# Patient Record
Sex: Female | Born: 1945
Health system: Southern US, Community
[De-identification: ages and names within clinical notes are randomized; demographics above are authoritative.]

## PROBLEM LIST (undated history)

## (undated) DIAGNOSIS — E7800 Pure hypercholesterolemia, unspecified: Secondary | ICD-10-CM

## (undated) DIAGNOSIS — E119 Type 2 diabetes mellitus without complications: Secondary | ICD-10-CM

## (undated) DIAGNOSIS — K219 Gastro-esophageal reflux disease without esophagitis: Secondary | ICD-10-CM

## (undated) DIAGNOSIS — J45909 Unspecified asthma, uncomplicated: Secondary | ICD-10-CM

## (undated) DIAGNOSIS — E079 Disorder of thyroid, unspecified: Secondary | ICD-10-CM

## (undated) DIAGNOSIS — L309 Dermatitis, unspecified: Secondary | ICD-10-CM

## (undated) DIAGNOSIS — I10 Essential (primary) hypertension: Secondary | ICD-10-CM

## (undated) HISTORY — PX: THYROID SURGERY: SHX805

## (undated) HISTORY — PX: HAND SURGERY: SHX662

## (undated) HISTORY — PX: ABDOMINAL HYSTERECTOMY: SHX81

## (undated) HISTORY — DX: Dermatitis, unspecified: L30.9

## (undated) HISTORY — PX: OTHER SURGICAL HISTORY: SHX169

## (undated) HISTORY — PX: UTERINE FIBROID SURGERY: SHX826

---

## 1998-12-15 ENCOUNTER — Encounter: Payer: Self-pay | Admitting: Family Medicine

## 1998-12-15 ENCOUNTER — Ambulatory Visit (HOSPITAL_COMMUNITY): Admission: RE | Admit: 1998-12-15 | Discharge: 1998-12-15 | Payer: Self-pay | Admitting: Family Medicine

## 1999-01-08 ENCOUNTER — Ambulatory Visit (HOSPITAL_COMMUNITY): Admission: RE | Admit: 1999-01-08 | Discharge: 1999-01-08 | Payer: Self-pay | Admitting: Family Medicine

## 1999-01-08 ENCOUNTER — Encounter: Payer: Self-pay | Admitting: Family Medicine

## 1999-02-05 ENCOUNTER — Encounter: Payer: Self-pay | Admitting: Family Medicine

## 1999-02-07 ENCOUNTER — Ambulatory Visit (HOSPITAL_COMMUNITY): Admission: RE | Admit: 1999-02-07 | Discharge: 1999-02-08 | Payer: Self-pay | Admitting: General Surgery

## 1999-11-15 ENCOUNTER — Other Ambulatory Visit: Admission: RE | Admit: 1999-11-15 | Discharge: 1999-11-15 | Payer: Self-pay | Admitting: *Deleted

## 1999-12-12 ENCOUNTER — Ambulatory Visit (HOSPITAL_COMMUNITY): Admission: RE | Admit: 1999-12-12 | Discharge: 1999-12-12 | Payer: Self-pay | Admitting: Gastroenterology

## 2001-11-30 ENCOUNTER — Encounter: Payer: Self-pay | Admitting: Family Medicine

## 2001-11-30 ENCOUNTER — Ambulatory Visit (HOSPITAL_COMMUNITY): Admission: RE | Admit: 2001-11-30 | Discharge: 2001-11-30 | Payer: Self-pay | Admitting: Family Medicine

## 2002-01-06 ENCOUNTER — Ambulatory Visit (HOSPITAL_COMMUNITY): Admission: RE | Admit: 2002-01-06 | Discharge: 2002-01-06 | Payer: Self-pay | Admitting: Family Medicine

## 2002-01-06 ENCOUNTER — Encounter: Payer: Self-pay | Admitting: Family Medicine

## 2002-03-19 ENCOUNTER — Ambulatory Visit (HOSPITAL_COMMUNITY): Admission: RE | Admit: 2002-03-19 | Discharge: 2002-03-19 | Payer: Self-pay | Admitting: Family Medicine

## 2002-03-19 ENCOUNTER — Encounter: Payer: Self-pay | Admitting: Family Medicine

## 2002-12-03 ENCOUNTER — Encounter: Payer: Self-pay | Admitting: Family Medicine

## 2002-12-03 ENCOUNTER — Ambulatory Visit (HOSPITAL_COMMUNITY): Admission: RE | Admit: 2002-12-03 | Discharge: 2002-12-03 | Payer: Self-pay | Admitting: Family Medicine

## 2003-12-06 ENCOUNTER — Ambulatory Visit (HOSPITAL_COMMUNITY): Admission: RE | Admit: 2003-12-06 | Discharge: 2003-12-06 | Payer: Self-pay | Admitting: Family Medicine

## 2004-05-29 ENCOUNTER — Ambulatory Visit (HOSPITAL_COMMUNITY): Admission: RE | Admit: 2004-05-29 | Discharge: 2004-05-29 | Payer: Self-pay | Admitting: Family Medicine

## 2004-09-05 ENCOUNTER — Other Ambulatory Visit: Admission: RE | Admit: 2004-09-05 | Discharge: 2004-09-05 | Payer: Self-pay | Admitting: Family Medicine

## 2004-12-20 ENCOUNTER — Ambulatory Visit (HOSPITAL_COMMUNITY): Admission: RE | Admit: 2004-12-20 | Discharge: 2004-12-20 | Payer: Self-pay | Admitting: Family Medicine

## 2005-12-27 ENCOUNTER — Ambulatory Visit (HOSPITAL_COMMUNITY): Admission: RE | Admit: 2005-12-27 | Discharge: 2005-12-27 | Payer: Self-pay | Admitting: Family Medicine

## 2006-12-30 ENCOUNTER — Ambulatory Visit (HOSPITAL_COMMUNITY): Admission: RE | Admit: 2006-12-30 | Discharge: 2006-12-30 | Payer: Self-pay | Admitting: Family Medicine

## 2008-01-01 ENCOUNTER — Ambulatory Visit (HOSPITAL_COMMUNITY): Admission: RE | Admit: 2008-01-01 | Discharge: 2008-01-01 | Payer: Self-pay | Admitting: Family Medicine

## 2009-01-11 ENCOUNTER — Ambulatory Visit (HOSPITAL_COMMUNITY): Admission: RE | Admit: 2009-01-11 | Discharge: 2009-01-11 | Payer: Self-pay | Admitting: Obstetrics and Gynecology

## 2010-01-12 ENCOUNTER — Ambulatory Visit (HOSPITAL_COMMUNITY): Admission: RE | Admit: 2010-01-12 | Discharge: 2010-01-12 | Payer: Self-pay | Admitting: Family Medicine

## 2010-12-21 ENCOUNTER — Other Ambulatory Visit (HOSPITAL_COMMUNITY): Payer: Self-pay | Admitting: Family Medicine

## 2010-12-21 DIAGNOSIS — Z1239 Encounter for other screening for malignant neoplasm of breast: Secondary | ICD-10-CM

## 2011-01-14 ENCOUNTER — Ambulatory Visit (HOSPITAL_COMMUNITY): Payer: Self-pay

## 2011-01-15 ENCOUNTER — Ambulatory Visit (HOSPITAL_COMMUNITY)
Admission: RE | Admit: 2011-01-15 | Discharge: 2011-01-15 | Disposition: A | Discharge status: Home / Self Care | Payer: Medicare Other | Source: Ambulatory Visit | Attending: Family Medicine | Admitting: Family Medicine

## 2011-01-15 DIAGNOSIS — Z1239 Encounter for other screening for malignant neoplasm of breast: Secondary | ICD-10-CM

## 2011-01-15 DIAGNOSIS — Z1231 Encounter for screening mammogram for malignant neoplasm of breast: Secondary | ICD-10-CM | POA: Insufficient documentation

## 2011-04-19 NOTE — Procedures (Signed)
[place]  Patient:    Valerie Thomas                         MRN: [id number] Proc. Date: 12/12/99 Adm. Date:  [id number] Attending:  Charna Elizabeth CC:         Geraldo Pitter, M.D.                           Procedure Report  DATE OF BIRTH:  06-11-1946.  REFERRING PHYSICIAN:  Geraldo Pitter, M.D.  PROCEDURE PERFORMED:  Flexible sigmoidoscopy.  ENDOSCOPIST:  Anselmo Rod, M.D.  INSTRUMENT USED:  Olympus video colonoscope.  INDICATIONS FOR PROCEDURE:  The patient is a 65 year old black female undergoing screening flexible sigmoidoscopy to rule out polyps, masses, etc.  PREPROCEDURE PREPARATION:  Informed consent was procured from the patient.  The  patient was fasted for eight hours prior to the procedure and prepped with two Fleets enemas the morning of the procedure.  PREPROCEDURE PHYSICAL:  The patient had stable vital signs.  Neck supple. Chest clear to auscultation.  S1, S2 regular.  Abdomen soft with normal abdominal bowel sounds.  DESCRIPTION OF PROCEDURE:  The patient was placed in the left lateral decubitus  position.  No sedation was used.  Once the patient was adequately positioned, the Olympus video colonoscope was advanced from the rectum to 40 cm.  There was some solid stool at 40 cm and therefore the procedure had to be aborted at that point. However, from the anal sphincter up to 40 cm, no abnormalities were seen except for small nonbleeding internal hemorrhoids.  The patient tolerated the procedure well without complications.  IMPRESSION:  Normal flexible sigmoidoscopy up to 40 cm except for small nonbleeding internal hemorrhoids.  RECOMMENDATIONS: 1. Patient has been advised to follow up with Dr. Parke Simmers and to contact me for  further GI problems. 2. Repeat flexible sigmoidoscopy recommended next five years or earlier if needed.DD:  12/12/99 TD:  12/12/99 Job: [id number]

## 2011-12-17 ENCOUNTER — Other Ambulatory Visit (HOSPITAL_COMMUNITY): Payer: Self-pay | Admitting: Family Medicine

## 2011-12-17 DIAGNOSIS — Z139 Encounter for screening, unspecified: Secondary | ICD-10-CM

## 2012-01-17 ENCOUNTER — Ambulatory Visit (HOSPITAL_COMMUNITY)
Admission: RE | Admit: 2012-01-17 | Discharge: 2012-01-17 | Disposition: A | Discharge status: Home / Self Care | Payer: Medicare Other | Source: Ambulatory Visit | Attending: Family Medicine | Admitting: Family Medicine

## 2012-01-17 DIAGNOSIS — Z1231 Encounter for screening mammogram for malignant neoplasm of breast: Secondary | ICD-10-CM | POA: Insufficient documentation

## 2012-01-17 DIAGNOSIS — Z139 Encounter for screening, unspecified: Secondary | ICD-10-CM

## 2012-12-15 ENCOUNTER — Other Ambulatory Visit (HOSPITAL_COMMUNITY): Payer: Self-pay | Admitting: Family Medicine

## 2012-12-15 DIAGNOSIS — Z139 Encounter for screening, unspecified: Secondary | ICD-10-CM

## 2013-01-18 ENCOUNTER — Ambulatory Visit (HOSPITAL_COMMUNITY): Payer: Medicare Other

## 2013-01-22 ENCOUNTER — Ambulatory Visit (HOSPITAL_COMMUNITY)
Admission: RE | Admit: 2013-01-22 | Discharge: 2013-01-22 | Disposition: A | Discharge status: Home / Self Care | Payer: Medicare Other | Source: Ambulatory Visit | Attending: Family Medicine | Admitting: Family Medicine

## 2013-01-22 DIAGNOSIS — Z1231 Encounter for screening mammogram for malignant neoplasm of breast: Secondary | ICD-10-CM | POA: Insufficient documentation

## 2013-01-22 DIAGNOSIS — Z139 Encounter for screening, unspecified: Secondary | ICD-10-CM

## 2013-03-06 ENCOUNTER — Encounter (HOSPITAL_COMMUNITY): Payer: Self-pay

## 2013-03-06 ENCOUNTER — Emergency Department (HOSPITAL_COMMUNITY)
Admission: EM | Admit: 2013-03-06 | Discharge: 2013-03-06 | Disposition: A | Discharge status: Home / Self Care | Payer: Medicare Other | Attending: Emergency Medicine | Admitting: Emergency Medicine

## 2013-03-06 ENCOUNTER — Emergency Department (HOSPITAL_COMMUNITY): Payer: Medicare Other

## 2013-03-06 DIAGNOSIS — Z7982 Long term (current) use of aspirin: Secondary | ICD-10-CM | POA: Insufficient documentation

## 2013-03-06 DIAGNOSIS — E119 Type 2 diabetes mellitus without complications: Secondary | ICD-10-CM | POA: Insufficient documentation

## 2013-03-06 DIAGNOSIS — Z79899 Other long term (current) drug therapy: Secondary | ICD-10-CM | POA: Insufficient documentation

## 2013-03-06 DIAGNOSIS — Y92009 Unspecified place in unspecified non-institutional (private) residence as the place of occurrence of the external cause: Secondary | ICD-10-CM | POA: Insufficient documentation

## 2013-03-06 DIAGNOSIS — Y9389 Activity, other specified: Secondary | ICD-10-CM | POA: Insufficient documentation

## 2013-03-06 DIAGNOSIS — E079 Disorder of thyroid, unspecified: Secondary | ICD-10-CM | POA: Insufficient documentation

## 2013-03-06 DIAGNOSIS — S0990XA Unspecified injury of head, initial encounter: Secondary | ICD-10-CM | POA: Insufficient documentation

## 2013-03-06 DIAGNOSIS — E7800 Pure hypercholesterolemia, unspecified: Secondary | ICD-10-CM | POA: Insufficient documentation

## 2013-03-06 DIAGNOSIS — I10 Essential (primary) hypertension: Secondary | ICD-10-CM | POA: Insufficient documentation

## 2013-03-06 DIAGNOSIS — S0121XA Laceration without foreign body of nose, initial encounter: Secondary | ICD-10-CM

## 2013-03-06 DIAGNOSIS — S01511A Laceration without foreign body of lip, initial encounter: Secondary | ICD-10-CM

## 2013-03-06 DIAGNOSIS — W010XXA Fall on same level from slipping, tripping and stumbling without subsequent striking against object, initial encounter: Secondary | ICD-10-CM | POA: Insufficient documentation

## 2013-03-06 DIAGNOSIS — S01501A Unspecified open wound of lip, initial encounter: Secondary | ICD-10-CM | POA: Insufficient documentation

## 2013-03-06 DIAGNOSIS — J45909 Unspecified asthma, uncomplicated: Secondary | ICD-10-CM | POA: Insufficient documentation

## 2013-03-06 DIAGNOSIS — K219 Gastro-esophageal reflux disease without esophagitis: Secondary | ICD-10-CM | POA: Insufficient documentation

## 2013-03-06 DIAGNOSIS — Z23 Encounter for immunization: Secondary | ICD-10-CM | POA: Insufficient documentation

## 2013-03-06 DIAGNOSIS — S0120XA Unspecified open wound of nose, initial encounter: Secondary | ICD-10-CM | POA: Insufficient documentation

## 2013-03-06 DIAGNOSIS — M129 Arthropathy, unspecified: Secondary | ICD-10-CM | POA: Insufficient documentation

## 2013-03-06 HISTORY — DX: Type 2 diabetes mellitus without complications: E11.9

## 2013-03-06 HISTORY — DX: Disorder of thyroid, unspecified: E07.9

## 2013-03-06 HISTORY — DX: Unspecified asthma, uncomplicated: J45.909

## 2013-03-06 HISTORY — DX: Essential (primary) hypertension: I10

## 2013-03-06 HISTORY — DX: Pure hypercholesterolemia, unspecified: E78.00

## 2013-03-06 HISTORY — DX: Gastro-esophageal reflux disease without esophagitis: K21.9

## 2013-03-06 LAB — CBC WITH DIFFERENTIAL/PLATELET
Basophils Relative: 0 % (ref 0–1)
HCT: 39.5 % (ref 36.0–46.0)
Lymphocytes Relative: 15 % (ref 12–46)
Lymphs Abs: 1.5 10*3/uL (ref 0.7–4.0)
Monocytes Relative: 6 % (ref 3–12)
Neutro Abs: 8 10*3/uL — ABNORMAL HIGH (ref 1.7–7.7)
Neutrophils Relative %: 78 % — ABNORMAL HIGH (ref 43–77)
RDW: 14.3 % (ref 11.5–15.5)
WBC: 10.2 10*3/uL (ref 4.0–10.5)

## 2013-03-06 LAB — URINALYSIS, ROUTINE W REFLEX MICROSCOPIC
Bilirubin Urine: NEGATIVE
Hgb urine dipstick: NEGATIVE
Specific Gravity, Urine: 1.01 (ref 1.005–1.030)
pH: 7 (ref 5.0–8.0)

## 2013-03-06 LAB — COMPREHENSIVE METABOLIC PANEL
AST: 17 U/L (ref 0–37)
BUN: 13 mg/dL (ref 6–23)
CO2: 27 mEq/L (ref 19–32)
Calcium: 9.3 mg/dL (ref 8.4–10.5)
Creatinine, Ser: 0.64 mg/dL (ref 0.50–1.10)
Glucose, Bld: 137 mg/dL — ABNORMAL HIGH (ref 70–99)

## 2013-03-06 MED ORDER — TETANUS-DIPHTH-ACELL PERTUSSIS 5-2.5-18.5 LF-MCG/0.5 IM SUSP
0.5000 mL | Freq: Once | INTRAMUSCULAR | Status: AC
  Administered 2013-03-06: 0.5 mL via INTRAMUSCULAR
  Filled 2013-03-06 (×2): qty 0.5

## 2013-03-06 MED ORDER — HYDROCODONE-ACETAMINOPHEN 5-325 MG PO TABS: 1.0000 | ORAL_TABLET | ORAL | Status: DC | PRN

## 2013-03-06 MED ORDER — PENICILLIN V POTASSIUM 500 MG PO TABS: 500.0000 mg | ORAL_TABLET | Freq: Three times a day (TID) | ORAL | Status: DC

## 2013-03-06 MED ORDER — LIDOCAINE HCL (PF) 2 % IJ SOLN
10.0000 mL | Freq: Once | INTRAMUSCULAR | Status: AC
  Administered 2013-03-06: 10 mL
  Filled 2013-03-06: qty 10

## 2013-03-06 NOTE — ED Notes (Signed)
Suture cart and lidocaine at bedside.  Called pharmacy for tdap vaccine.

## 2013-03-06 NOTE — ED Notes (Signed)
Pt reports that she was "rushing out of house" tripped and fell, abrasions noted to face, stated she saw stars and is lightheaded now, denies any n/v.  Small laceration to lip.

## 2013-03-06 NOTE — ED Provider Notes (Signed)
History    This chart was scribed for Carleene Cooper III, MD by Charolett Bumpers, ED Scribe. The patient was seen in room APA15/APA15. Patient's care was started at 12:58.   CSN: [id number]  Arrival date & time 03/06/13  1140   First MD Initiated Contact with Patient 03/06/13 1258      Chief Complaint  Patient presents with  . Fall    The history is provided by the patient. No language interpreter was used.   Valerie Thomas is a 67 y.o. female who presents to the Emergency Department complaining of a fall that occurred just PTA. She states that she was rushing out of the house when she tripped and fell. She denies any LOC but states she had profuse bleeding due to her facial injuries. She has abrasions to her forehead, nose, just below her nose with a laceration to her lip. She also chipped one of her front teeth during the fall and complains of left leg pain. She reports associated light-headedness after the fall that has since resolved and states that she now has a burning sensation in her right face. She states her Tetanus needs to be updated. She was able to ambulate after the fall. She has a h/o HTN, DM, arthritis, thyroid disease and hypercholesteremia.   Past Medical History  Diagnosis Date  . Hypertension   . Diabetes mellitus without complication   . Thyroid disease   . Acid reflux   . Asthma   . Hypercholesteremia   At bedside: H/o arthritis   Past Surgical History  Procedure Laterality Date  . Thyroid surgery    . Uterine fibroid surgery    . Hand surgery    . Torn ligament      neck surgery   . Abdominal hysterectomy      No family history on file.  History  Substance Use Topics  . Smoking status: Never Smoker   . Smokeless tobacco: Not on file  . Alcohol Use: No    OB History   Grav Para Term Preterm Abortions TAB SAB Ect Mult Living                  Review of Systems  Musculoskeletal: Positive for arthralgias.       Left leg pain  Skin:  Positive for wound.  All other systems reviewed and are negative.    Allergies  Review of patient's allergies indicates no active allergies.  Home Medications   Current Outpatient Rx  Name  Route  Sig  Dispense  Refill  . albuterol (PROVENTIL HFA;VENTOLIN HFA) 108 (90 BASE) MCG/ACT inhaler   Inhalation   Inhale 2 puffs into the lungs every 6 (six) hours as needed for wheezing.         Marland Kitchen amLODipine (NORVASC) 10 MG tablet   Oral   Take 10 mg by mouth daily.         Marland Kitchen aspirin EC 81 MG tablet   Oral   Take 81 mg by mouth daily.         . bisoprolol-hydrochlorothiazide (ZIAC) 5-6.25 MG per tablet   Oral   Take 1 tablet by mouth daily.         Marland Kitchen Cod Liver Oil CAPS   Oral   Take 1 capsule by mouth daily.         Marland Kitchen glyBURIDE-metformin (GLUCOVANCE) 1.25-250 MG per tablet   Oral   Take 1 tablet by mouth daily with breakfast.         .  levothyroxine (SYNTHROID, LEVOTHROID) 100 MCG tablet   Oral   Take 100 mcg by mouth daily before breakfast.         . losartan-hydrochlorothiazide (HYZAAR) 100-25 MG per tablet   Oral   Take 1 tablet by mouth daily.         . montelukast (SINGULAIR) 10 MG tablet   Oral   Take 10 mg by mouth daily.         Marland Kitchen omeprazole (PRILOSEC) 40 MG capsule   Oral   Take 40 mg by mouth daily.         Bertram Gala Glycol-Propyl Glycol (SYSTANE OP)   Ophthalmic   Apply 1 drop to eye daily as needed (dry eyes).         . potassium chloride SA (K-DUR,KLOR-CON) 20 MEQ tablet   Oral   Take 20 mEq by mouth 2 (two) times daily.         . simvastatin (ZOCOR) 40 MG tablet   Oral   Take 40 mg by mouth daily.         . sodium chloride (OCEAN) 0.65 % nasal spray   Nasal   Place 1 spray into the nose as needed for congestion.         . sulindac (CLINORIL) 200 MG tablet   Oral   Take 200 mg by mouth 2 (two) times daily.           Triage Vitals: BP 134/68  Pulse 66  Temp(Src) 98 F (36.7 C) (Oral)  Resp 20  Ht 5\' 2"   (1.575 m)  Wt 194 lb (87.998 kg)  BMI 35.47 kg/m2  SpO2 98%  Physical Exam  Nursing note and vitals reviewed. Constitutional: She is oriented to person, place, and time. She appears well-developed and well-nourished. No distress.  HENT:  Head: Normocephalic.  Right Ear: External ear normal.  Left Ear: External ear normal.  Mouth/Throat: Oropharynx is clear and moist. No oropharyngeal exudate.  Laceration on nose with an area of skin loss that is 1 x 1.5 cm. On the lip, a semi-circular laceration that was caused by tooth. Left upper central incisor is broken off.   Eyes: Conjunctivae and EOM are normal. Pupils are equal, round, and reactive to light.  Neck: Normal range of motion. Neck supple. No tracheal deviation present.  Cardiovascular: Normal rate, regular rhythm and normal heart sounds.   Pulmonary/Chest: Effort normal and breath sounds normal. No respiratory distress. She has no wheezes.  Abdominal: Soft. She exhibits no distension.  Musculoskeletal: Normal range of motion. She exhibits no edema.  Neurological: She is alert and oriented to person, place, and time.  Skin: Skin is warm and dry.  Psychiatric: She has a normal mood and affect. Her behavior is normal.    ED Course  LACERATION REPAIR Date/Time: 03/06/2013 3:15 PM Performed by: Osvaldo Human Authorized by: Osvaldo Human Consent: Verbal consent obtained. Consent given by: patient Patient understanding: patient states understanding of the procedure being performed Patient consent: the patient's understanding of the procedure matches consent given Site marked: the operative site was not marked Patient identity confirmed: verbally with patient Time out: Immediately prior to procedure a "time out" was called to verify the correct patient, procedure, equipment, support staff and site/side marked as required. Body area: head/neck Location details: nose Laceration length: 1 cm Foreign bodies: no foreign  bodies Tendon involvement: none Nerve involvement: none Vascular damage: no Anesthesia: local infiltration Local anesthetic: lidocaine 2% without epinephrine Patient sedated:  no Preparation: Patient was prepped and draped in the usual sterile fashion. Irrigation solution: saline Irrigation method: tap Amount of cleaning: standard Debridement: none Degree of undermining: none Skin closure: 6-0 nylon Number of sutures: 2 Technique: simple Approximation: loose Approximation difficulty: simple Patient tolerance: Patient tolerated the procedure well with no immediate complications. Comments: Repair of lower lip mucosal laceration:  Lower lip prepped with normal saline and anesthetized with 2% lidocaine.  Closed with 3 interrupted 5-0 Vicryl Rapide sutures.  Tolerated well.    (including critical care time)  DIAGNOSTIC STUDIES: Oxygen Saturation is 98% on room air, normal by my interpretation.    COORDINATION OF CARE:  1:23 PM-Discussed planned course of treatment with the patient including x-rays, who is agreeable at this time.    12:59 PM  Date: 03/06/2013  Rate: 70  Rhythm: normal sinus rhythm  QRS Axis: normal  Intervals: normal  ST/T Wave abnormalities: nonspecific T wave changes  Conduction Disutrbances:none  Narrative Interpretation: Abnormal EKG  Old EKG Reviewed: unchanged   Results for orders placed during the hospital encounter of 03/06/13  CBC WITH DIFFERENTIAL      Result Value Range   WBC 10.2  4.0 - 10.5 K/uL   RBC 4.54  3.87 - 5.11 MIL/uL   Hemoglobin 13.0  12.0 - 15.0 g/dL   HCT 65.7  84.6 - 96.2 %   MCV 87.0  78.0 - 100.0 fL   MCH 28.6  26.0 - 34.0 pg   MCHC 32.9  30.0 - 36.0 g/dL   RDW 95.2  84.1 - 32.4 %   Platelets 231  150 - 400 K/uL   Neutrophils Relative 78 (*) 43 - 77 %   Neutro Abs 8.0 (*) 1.7 - 7.7 K/uL   Lymphocytes Relative 15  12 - 46 %   Lymphs Abs 1.5  0.7 - 4.0 K/uL   Monocytes Relative 6  3 - 12 %   Monocytes Absolute 0.6  0.1 -  1.0 K/uL   Eosinophils Relative 1  0 - 5 %   Eosinophils Absolute 0.1  0.0 - 0.7 K/uL   Basophils Relative 0  0 - 1 %   Basophils Absolute 0.0  0.0 - 0.1 K/uL  COMPREHENSIVE METABOLIC PANEL      Result Value Range   Sodium 139  135 - 145 mEq/L   Potassium 3.2 (*) 3.5 - 5.1 mEq/L   Chloride 99  96 - 112 mEq/L   CO2 27  19 - 32 mEq/L   Glucose, Bld 137 (*) 70 - 99 mg/dL   BUN 13  6 - 23 mg/dL   Creatinine, Ser 4.01  0.50 - 1.10 mg/dL   Calcium 9.3  8.4 - 02.7 mg/dL   Total Protein 7.8  6.0 - 8.3 g/dL   Albumin 3.9  3.5 - 5.2 g/dL   AST 17  0 - 37 U/L   ALT 8  0 - 35 U/L   Alkaline Phosphatase 113  39 - 117 U/L   Total Bilirubin 0.4  0.3 - 1.2 mg/dL   GFR calc non Af Amer 90 (*) >90 mL/min   GFR calc Af Amer >90  >90 mL/min  URINALYSIS, ROUTINE W REFLEX MICROSCOPIC      Result Value Range   Color, Urine YELLOW  YELLOW   APPearance CLEAR  CLEAR   Specific Gravity, Urine 1.010  1.005 - 1.030   pH 7.0  5.0 - 8.0   Glucose, UA NEGATIVE  NEGATIVE mg/dL   Hgb  urine dipstick NEGATIVE  NEGATIVE   Bilirubin Urine NEGATIVE  NEGATIVE   Ketones, ur NEGATIVE  NEGATIVE mg/dL   Protein, ur NEGATIVE  NEGATIVE mg/dL   Urobilinogen, UA 0.2  0.0 - 1.0 mg/dL   Nitrite NEGATIVE  NEGATIVE   Leukocytes, UA NEGATIVE  NEGATIVE   Ct Head Wo Contrast  03/06/2013  *RADIOLOGY REPORT*  Clinical Data:  Fall.  CT HEAD WITHOUT CONTRAST CT MAXILLOFACIAL WITHOUT CONTRAST  Technique:  Multidetector CT imaging of the head and maxillofacial structures were performed using the standard protocol without intravenous contrast. Multiplanar CT image reconstructions of the maxillofacial structures were also generated.  Comparison:   None.  CT HEAD  Findings: There is prominence of the sulci and ventricles consistent with brain atrophy.  There is mild low attenuation within the subcortical and periventricular white matter consistent with chronic small vessel ischemic change.  There is no evidence for acute brain infarct,  hemorrhage or mass.  The paranasal sinuses are clear.  The mastoid air cells are clear. The skull appears intact.  IMPRESSION:  1.  No acute intracranial abnormalities. 2.  Mild brain atrophy.  CT MAXILLOFACIAL  Findings:   The paranasal sinuses are clear.  No mucosal thickening or fluid levels identified.  There is no evidence for blowout fracture.  The mandible appears intact.  The maxilla appears normal.  No facial bone fracture identified.  IMPRESSION:  1.  No evidence for facial bone fracture.   Original Report Authenticated By: Signa Kell, M.D.    Ct Maxillofacial Wo Cm  03/06/2013  *RADIOLOGY REPORT*  Clinical Data:  Fall.  CT HEAD WITHOUT CONTRAST CT MAXILLOFACIAL WITHOUT CONTRAST  Technique:  Multidetector CT imaging of the head and maxillofacial structures were performed using the standard protocol without intravenous contrast. Multiplanar CT image reconstructions of the maxillofacial structures were also generated.  Comparison:   None.  CT HEAD  Findings: There is prominence of the sulci and ventricles consistent with brain atrophy.  There is mild low attenuation within the subcortical and periventricular white matter consistent with chronic small vessel ischemic change.  There is no evidence for acute brain infarct, hemorrhage or mass.  The paranasal sinuses are clear.  The mastoid air cells are clear. The skull appears intact.  IMPRESSION:  1.  No acute intracranial abnormalities. 2.  Mild brain atrophy.  CT MAXILLOFACIAL  Findings:   The paranasal sinuses are clear.  No mucosal thickening or fluid levels identified.  There is no evidence for blowout fracture.  The mandible appears intact.  The maxilla appears normal.  No facial bone fracture identified.  IMPRESSION:  1.  No evidence for facial bone fracture.   Original Report Authenticated By: Signa Kell, M.D.     2:43 PM-Informed pt of imaging and lab results.   3:56 PM Rx with Pen VK 500 mg tid to prevent infection, and  hydrocodone-acetaminophen every 4 hours if needed for pain.  Sutures out in 4-5 days.  1. Laceration of nose without complication, initial encounter   2. Laceration of lip without complication, initial encounter      I personally performed the services described in this documentation, which was scribed in my presence. The recorded information has been reviewed and is accurate.  Osvaldo Human, M.D.       Carleene Cooper III, MD 03/06/13 [phone]

## 2013-03-06 NOTE — ED Notes (Addendum)
Patient reports that she does not remember what happened to make her fall today, states that she is having a burning sensation to her right face currently.  Is unsure if she passed out or not.  States that she just remembers getting up and blood everywhere.  Abrasions noted to mid anterior forehead, nose, upper and lower lip close to vermilion border, as well as laceration noted inside lower lip, chipped front tooth.

## 2013-11-16 ENCOUNTER — Other Ambulatory Visit (HOSPITAL_COMMUNITY): Payer: Self-pay | Admitting: Family Medicine

## 2013-11-16 DIAGNOSIS — Z139 Encounter for screening, unspecified: Secondary | ICD-10-CM

## 2014-01-24 ENCOUNTER — Ambulatory Visit (HOSPITAL_COMMUNITY)
Admission: RE | Admit: 2014-01-24 | Discharge: 2014-01-24 | Disposition: A | Discharge status: Home / Self Care | Payer: Medicare Other | Source: Ambulatory Visit | Attending: Family Medicine | Admitting: Family Medicine

## 2014-01-24 DIAGNOSIS — Z1231 Encounter for screening mammogram for malignant neoplasm of breast: Secondary | ICD-10-CM | POA: Insufficient documentation

## 2014-01-24 DIAGNOSIS — Z139 Encounter for screening, unspecified: Secondary | ICD-10-CM

## 2014-12-07 DIAGNOSIS — E782 Mixed hyperlipidemia: Secondary | ICD-10-CM | POA: Diagnosis not present

## 2014-12-07 DIAGNOSIS — E0800 Diabetes mellitus due to underlying condition with hyperosmolarity without nonketotic hyperglycemic-hyperosmolar coma (NKHHC): Secondary | ICD-10-CM | POA: Diagnosis not present

## 2014-12-07 DIAGNOSIS — J398 Other specified diseases of upper respiratory tract: Secondary | ICD-10-CM | POA: Diagnosis not present

## 2014-12-12 DIAGNOSIS — E1142 Type 2 diabetes mellitus with diabetic polyneuropathy: Secondary | ICD-10-CM | POA: Diagnosis not present

## 2014-12-12 DIAGNOSIS — E1140 Type 2 diabetes mellitus with diabetic neuropathy, unspecified: Secondary | ICD-10-CM | POA: Diagnosis not present

## 2014-12-14 ENCOUNTER — Other Ambulatory Visit (HOSPITAL_COMMUNITY): Payer: Self-pay | Admitting: Family Medicine

## 2014-12-14 DIAGNOSIS — Z1231 Encounter for screening mammogram for malignant neoplasm of breast: Secondary | ICD-10-CM

## 2015-01-05 DIAGNOSIS — H00029 Hordeolum internum unspecified eye, unspecified eyelid: Secondary | ICD-10-CM | POA: Diagnosis not present

## 2015-01-26 ENCOUNTER — Ambulatory Visit (HOSPITAL_COMMUNITY)
Admission: RE | Admit: 2015-01-26 | Discharge: 2015-01-26 | Disposition: A | Discharge status: Home / Self Care | Payer: Medicare Other | Source: Ambulatory Visit | Attending: Family Medicine | Admitting: Family Medicine

## 2015-01-26 DIAGNOSIS — Z1231 Encounter for screening mammogram for malignant neoplasm of breast: Secondary | ICD-10-CM | POA: Diagnosis not present

## 2015-02-08 DIAGNOSIS — J398 Other specified diseases of upper respiratory tract: Secondary | ICD-10-CM | POA: Diagnosis not present

## 2015-02-08 DIAGNOSIS — E0800 Diabetes mellitus due to underlying condition with hyperosmolarity without nonketotic hyperglycemic-hyperosmolar coma (NKHHC): Secondary | ICD-10-CM | POA: Diagnosis not present

## 2015-02-08 DIAGNOSIS — I10 Essential (primary) hypertension: Secondary | ICD-10-CM | POA: Diagnosis not present

## 2015-02-09 DIAGNOSIS — E669 Obesity, unspecified: Secondary | ICD-10-CM | POA: Diagnosis not present

## 2015-02-09 DIAGNOSIS — K219 Gastro-esophageal reflux disease without esophagitis: Secondary | ICD-10-CM | POA: Diagnosis not present

## 2015-02-09 DIAGNOSIS — Z1211 Encounter for screening for malignant neoplasm of colon: Secondary | ICD-10-CM | POA: Diagnosis not present

## 2015-02-09 DIAGNOSIS — R194 Change in bowel habit: Secondary | ICD-10-CM | POA: Diagnosis not present

## 2015-02-21 DIAGNOSIS — E1142 Type 2 diabetes mellitus with diabetic polyneuropathy: Secondary | ICD-10-CM | POA: Diagnosis not present

## 2015-02-21 DIAGNOSIS — E1140 Type 2 diabetes mellitus with diabetic neuropathy, unspecified: Secondary | ICD-10-CM | POA: Diagnosis not present

## 2015-03-01 DIAGNOSIS — Z1211 Encounter for screening for malignant neoplasm of colon: Secondary | ICD-10-CM | POA: Diagnosis not present

## 2015-03-01 DIAGNOSIS — K5730 Diverticulosis of large intestine without perforation or abscess without bleeding: Secondary | ICD-10-CM | POA: Diagnosis not present

## 2015-04-05 DIAGNOSIS — Z0000 Encounter for general adult medical examination without abnormal findings: Secondary | ICD-10-CM | POA: Diagnosis not present

## 2015-04-05 DIAGNOSIS — E034 Atrophy of thyroid (acquired): Secondary | ICD-10-CM | POA: Diagnosis not present

## 2015-04-05 DIAGNOSIS — E0800 Diabetes mellitus due to underlying condition with hyperosmolarity without nonketotic hyperglycemic-hyperosmolar coma (NKHHC): Secondary | ICD-10-CM | POA: Diagnosis not present

## 2015-04-05 DIAGNOSIS — E876 Hypokalemia: Secondary | ICD-10-CM | POA: Diagnosis not present

## 2015-04-05 DIAGNOSIS — E039 Hypothyroidism, unspecified: Secondary | ICD-10-CM | POA: Diagnosis not present

## 2015-04-05 DIAGNOSIS — M130 Polyarthritis, unspecified: Secondary | ICD-10-CM | POA: Diagnosis not present

## 2015-04-05 DIAGNOSIS — I10 Essential (primary) hypertension: Secondary | ICD-10-CM | POA: Diagnosis not present

## 2015-05-17 DIAGNOSIS — H5203 Hypermetropia, bilateral: Secondary | ICD-10-CM | POA: Diagnosis not present

## 2015-05-17 DIAGNOSIS — E119 Type 2 diabetes mellitus without complications: Secondary | ICD-10-CM | POA: Diagnosis not present

## 2015-05-30 DIAGNOSIS — E0800 Diabetes mellitus due to underlying condition with hyperosmolarity without nonketotic hyperglycemic-hyperosmolar coma (NKHHC): Secondary | ICD-10-CM | POA: Diagnosis not present

## 2015-05-30 DIAGNOSIS — R21 Rash and other nonspecific skin eruption: Secondary | ICD-10-CM | POA: Diagnosis not present

## 2015-05-30 DIAGNOSIS — E782 Mixed hyperlipidemia: Secondary | ICD-10-CM | POA: Diagnosis not present

## 2015-05-30 DIAGNOSIS — L2081 Atopic neurodermatitis: Secondary | ICD-10-CM | POA: Diagnosis not present

## 2015-06-12 DIAGNOSIS — E1140 Type 2 diabetes mellitus with diabetic neuropathy, unspecified: Secondary | ICD-10-CM | POA: Diagnosis not present

## 2015-06-12 DIAGNOSIS — E1142 Type 2 diabetes mellitus with diabetic polyneuropathy: Secondary | ICD-10-CM | POA: Diagnosis not present

## 2015-07-22 DIAGNOSIS — T7840XA Allergy, unspecified, initial encounter: Secondary | ICD-10-CM | POA: Diagnosis not present

## 2015-08-04 DIAGNOSIS — R21 Rash and other nonspecific skin eruption: Secondary | ICD-10-CM | POA: Diagnosis not present

## 2015-08-04 DIAGNOSIS — E119 Type 2 diabetes mellitus without complications: Secondary | ICD-10-CM | POA: Diagnosis not present

## 2015-08-04 DIAGNOSIS — T7840XA Allergy, unspecified, initial encounter: Secondary | ICD-10-CM | POA: Diagnosis not present

## 2015-08-04 DIAGNOSIS — I10 Essential (primary) hypertension: Secondary | ICD-10-CM | POA: Diagnosis not present

## 2015-08-21 DIAGNOSIS — E1140 Type 2 diabetes mellitus with diabetic neuropathy, unspecified: Secondary | ICD-10-CM | POA: Diagnosis not present

## 2015-08-21 DIAGNOSIS — E1142 Type 2 diabetes mellitus with diabetic polyneuropathy: Secondary | ICD-10-CM | POA: Diagnosis not present

## 2015-09-13 DIAGNOSIS — E118 Type 2 diabetes mellitus with unspecified complications: Secondary | ICD-10-CM | POA: Diagnosis not present

## 2015-09-13 DIAGNOSIS — R21 Rash and other nonspecific skin eruption: Secondary | ICD-10-CM | POA: Diagnosis not present

## 2015-09-13 DIAGNOSIS — Z6833 Body mass index (BMI) 33.0-33.9, adult: Secondary | ICD-10-CM | POA: Diagnosis not present

## 2015-09-13 DIAGNOSIS — Z23 Encounter for immunization: Secondary | ICD-10-CM | POA: Diagnosis not present

## 2015-09-14 DIAGNOSIS — H10503 Unspecified blepharoconjunctivitis, bilateral: Secondary | ICD-10-CM | POA: Diagnosis not present

## 2015-10-31 ENCOUNTER — Encounter: Payer: Self-pay | Admitting: Allergy and Immunology

## 2015-10-31 ENCOUNTER — Ambulatory Visit (INDEPENDENT_AMBULATORY_CARE_PROVIDER_SITE_OTHER): Payer: Medicare Other | Admitting: Allergy and Immunology

## 2015-10-31 VITALS — BP 114/60 | HR 94 | Temp 98.1°F | Resp 16 | Ht 59.45 in | Wt 185.0 lb

## 2015-10-31 DIAGNOSIS — H1010 Acute atopic conjunctivitis, unspecified eye: Secondary | ICD-10-CM

## 2015-10-31 DIAGNOSIS — J309 Allergic rhinitis, unspecified: Secondary | ICD-10-CM

## 2015-10-31 DIAGNOSIS — R062 Wheezing: Secondary | ICD-10-CM | POA: Diagnosis not present

## 2015-10-31 DIAGNOSIS — R05 Cough: Secondary | ICD-10-CM

## 2015-10-31 DIAGNOSIS — L509 Urticaria, unspecified: Secondary | ICD-10-CM | POA: Diagnosis not present

## 2015-10-31 DIAGNOSIS — R059 Cough, unspecified: Secondary | ICD-10-CM

## 2015-10-31 MED ORDER — EPINEPHRINE 0.3 MG/0.3ML IJ SOAJ: 0.3000 mg | Freq: Once | INTRAMUSCULAR | Status: DC

## 2015-10-31 MED ORDER — FLUTICASONE PROPIONATE HFA 110 MCG/ACT IN AERO: 2.0000 | INHALATION_SPRAY | Freq: Every day | RESPIRATORY_TRACT | Status: DC

## 2015-10-31 NOTE — Patient Instructions (Addendum)
Take Home Sheet  1. Avoidance: Mite, Mold and Pollen      AND BEEF.  2. Antihistamine: Claritin 10mg  by mouth once daily for runny nose or itching.   3. Nasal Spray: Saline 2 spray(s) each nostril once daily for stuffy nose or drainage.    4. Inhalers:  Rescue: Ventolin 2 puffs every 4 hours as needed for cough or wheeze.       -May use 2 puffs 10-20 minutes prior to exercise.   Preventative: QVAR 46mcg 2 puffs once daily (Rinse, gargle, and spit out after use).     STOP SYMBICORT  5.  Continue Singulair 10mg  each evening.  6.  Epi-pen/Benadryl as needed.  7.  If new episodes of rash or hives take picture and write down environment/exposure/ingestion/activity.    Consider selected labs at [facility]. 8. Follow up Visit: 2 months or sooner if needed.   Websites that have reliable Patient information: 1. American Academy of Asthma, Allergy, & Immunology: www.aaaai.org 2. Food Allergy Network: www.foodallergy.org 3. Mothers of Asthmatics: www.aanma.org 4. [place]: DiningCalendar.de 5. American College of Allergy, Asthma, & Immunology: https://robertson.info/ or www.acaai.org

## 2015-10-31 NOTE — Progress Notes (Signed)
NEW PATIENT NOTE  RE: Valerie Thomas MRN: [id number] DOB: September 13, 1946 [facility] [place] [address], [place] [address] Date of Office Visit: 10/31/2015  Referring provider: Lucianne Lei, MD [address] [place], [place] [address]  Subjective:  Valerie Thomas is a 69 y.o. female who presents today for Allergic Reaction and Rash  Assessment:   1. Allergic rhinoconjunctivitis.  2. History of cough and wheeze, probable component of mild asthma.    3. Hives with clear skin today.  4.     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 5.     Complex medical history.  Plan:   Meds ordered this encounter  Medications  . EPINEPHrine (EPIPEN 2-PAK) 0.3 mg/0.3 mL IJ SOAJ injection    Sig: Inject 0.3 mLs (0.3 mg total) into the muscle once.    Dispense:  2 Device    Refill:  1  . fluticasone (FLOVENT HFA) 110 MCG/ACT inhaler    Sig: Inhale 2 puffs into the lungs daily.    Dispense:  1 Inhaler    Refill:  3   Patient Instructions   1. Avoidance: Mite, Mold and Pollen  AND BEEF. 2. Antihistamine: Claritin 10mg  by mouth once daily for runny nose or itching 3. Nasal Spray: Saline 2 spray(s) each nostril once daily for stuffy nose or drainage.  4. Inhalers:  Rescue: Ventolin 2 puffs every 4 hours as needed for cough or wheeze.       -May use 2 puffs 10-20 minutes prior to exercise.  Preventative: Flovent (formulary preference to QVAR) 174mcg 2 puffs once daily (Rinse, gargle, and spit out after use).     STOP SYMBICORT 5.  Continue Singulair 10mg  each evening. 6.  Epi-pen/Benadryl as needed. 7.  If new episodes of rash or hives take picture and write down environment/exposure/ingestion/activity.    Consider selected labs at [facility]. 8. Follow up Visit: 2 months or sooner if needed.  HPI: Valerie Thomas presents with a 5 year history of spring and winter season rhinorrhea, congestion, sneezing, itchy watery eyes, and postnasal drip.  She describes  increasing symptoms with dust, pollen, outdoor and fluctuant weather pattern exposures.  In addition a few times a year in the winter months and in late spring,  she notices increasing congestion with cough and wheeze including occasional shortness of breath where she has used Symbicort intermittently.  She denies pneumonia, and last bronchitis was 2 years ago.  Possibly, strong odors, perfumes, and cigarette smoke are provoking factors to symptoms well.  She does not note nocturnal or exercise induced difficulty and no recollection of snoring.  She has had systemic steroids, but no ED visits or respiratory hospitalizations.  In the last 6 months, there have been at least 2 courses of systemic steroids related to rash episodes, though given several occasions with an unclear trigger, she is interested in allergy testing.  She has not noticed increasing difficulty with specific food exposure, particular mealtime or ingestion.  Denies headache, sore throat or fever, discolored drainage, or other recurring associated difficulty. No recollection of tick bites.  Medical History: Past Medical History  Diagnosis Date  . Hypertension   . Diabetes mellitus without complication ([place])   . Thyroid disease   . Acid reflux   . Asthma   . Hypercholesteremia   . Eczema    Surgical History: Past Surgical History  Procedure Laterality Date  . Thyroid surgery    . Uterine fibroid surgery    .  Hand surgery    . Torn ligament      neck surgery   . Abdominal hysterectomy     Family History: Family History  Problem Relation Age of Onset  . Asthma Sister   . Allergic rhinitis Neg Hx   . Urticaria Neg Hx   . Eczema Neg Hx    Social History: Social History  . Marital Status: Widowed    Spouse Name: N/A  . Number of Children: N/A  . Years of Education: N/A   Social History Main Topics  . Smoking status: Never Smoker   . Smokeless tobacco: Not on file  . Alcohol Use: No  . Drug Use: No  . 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  Medications prior to this encounter: 1.  As needed Symbicort. 2.  Allegra 180 mg daily. 3.  Singulair 10 mg once daily. 4.  Daily  Amlodipine, losartan, simvastatin, Klor-Con, metformin, Synthroid, cod liver oil, 81 mg aspirin and sulindac.  Drug Allergies: No Known Allergies  Environmental History: Valerie Thomas lives in a 69 year old house for 25 years wood and tile floors, central air and heat without humidifier pets or smokers.  Sleeping on a stuffed mattress non-feather pillow and comforter.  Review of Systems  Constitutional: Negative for fever, weight loss and malaise/fatigue.  HENT: Positive for congestion. Negative for ear pain, hearing loss, nosebleeds and sore throat.   Eyes: Negative for discharge and redness.  Respiratory: Negative for shortness of breath.        Denies pneumonia.  Gastrointestinal: Negative for heartburn, nausea, vomiting, abdominal pain, diarrhea and constipation.  Genitourinary: Negative.   Musculoskeletal: Negative for myalgias and joint pain.  Skin: Negative.  Negative for itching and rash.  Neurological: Negative.  Negative for dizziness, seizures, weakness and headaches.  Endo/Heme/Allergies: Positive for environmental allergies.       Denies sensitivity to aspirin, NSAIDs, stinging insects, foods, latex, jewelry and cosmetics.    Objective:   Filed Vitals:   10/31/15 0948  BP: 114/60  Pulse: 94  Temp: 98.1 F (36.7 C)  Resp: 16  pulse ox             97%  Physical Exam  Constitutional: She is well-developed, well-nourished, and in no distress.  HENT:  Head: Atraumatic.  Right Ear: Tympanic membrane and ear canal normal.  Left Ear: Tympanic membrane and ear canal normal.  Nose: Mucosal edema present. No rhinorrhea. No epistaxis.  Mouth/Throat: Oropharynx is clear and moist and mucous membranes are normal. No oropharyngeal  exudate, posterior oropharyngeal edema or posterior oropharyngeal erythema.  Eyes: Conjunctivae are normal.  Neck: Neck supple.  Cardiovascular: Normal rate, S1 normal and S2 normal.   No murmur heard. Pulmonary/Chest: Effort normal. She has no wheezes. She has no rhonchi. She has no rales.  Abdominal: Soft. Normal appearance and bowel sounds are normal.  Musculoskeletal: She exhibits no edema.  Lymphadenopathy:    She has no cervical adenopathy.  Neurological: She is alert.  Skin: Skin is warm and intact. No rash noted. No cyanosis. Nails show no clubbing.    Diagnostics: Spirometry:  FVC  1.58--81%, FEV1 1.56--101%.    Roselyn M. Ishmael Holter, MD  cc:  Lucianne Lei, MD

## 2015-11-07 DIAGNOSIS — Z6833 Body mass index (BMI) 33.0-33.9, adult: Secondary | ICD-10-CM | POA: Diagnosis not present

## 2015-11-07 DIAGNOSIS — E089 Diabetes mellitus due to underlying condition without complications: Secondary | ICD-10-CM | POA: Diagnosis not present

## 2015-11-07 DIAGNOSIS — E039 Hypothyroidism, unspecified: Secondary | ICD-10-CM | POA: Diagnosis not present

## 2015-11-07 DIAGNOSIS — I10 Essential (primary) hypertension: Secondary | ICD-10-CM | POA: Diagnosis not present

## 2015-11-10 DIAGNOSIS — E118 Type 2 diabetes mellitus with unspecified complications: Secondary | ICD-10-CM | POA: Diagnosis not present

## 2015-11-13 DIAGNOSIS — E1142 Type 2 diabetes mellitus with diabetic polyneuropathy: Secondary | ICD-10-CM | POA: Diagnosis not present

## 2015-11-13 DIAGNOSIS — E1140 Type 2 diabetes mellitus with diabetic neuropathy, unspecified: Secondary | ICD-10-CM | POA: Diagnosis not present

## 2015-11-21 DIAGNOSIS — E1142 Type 2 diabetes mellitus with diabetic polyneuropathy: Secondary | ICD-10-CM | POA: Diagnosis not present

## 2015-11-21 DIAGNOSIS — E1140 Type 2 diabetes mellitus with diabetic neuropathy, unspecified: Secondary | ICD-10-CM | POA: Diagnosis not present

## 2015-12-04 DIAGNOSIS — M25579 Pain in unspecified ankle and joints of unspecified foot: Secondary | ICD-10-CM | POA: Diagnosis not present

## 2015-12-04 DIAGNOSIS — M79672 Pain in left foot: Secondary | ICD-10-CM | POA: Diagnosis not present

## 2015-12-21 ENCOUNTER — Other Ambulatory Visit (HOSPITAL_COMMUNITY): Payer: Self-pay | Admitting: Family Medicine

## 2015-12-21 DIAGNOSIS — Z1231 Encounter for screening mammogram for malignant neoplasm of breast: Secondary | ICD-10-CM

## 2015-12-25 ENCOUNTER — Telehealth: Payer: Self-pay | Admitting: Pediatrics

## 2015-12-25 NOTE — Telephone Encounter (Signed)
Left message - it appears we did not adjust enough on testing - adjusted $50

## 2015-12-25 NOTE — Telephone Encounter (Signed)
Patient has question about a bill received

## 2016-01-02 ENCOUNTER — Ambulatory Visit: Payer: Medicare Other | Admitting: Allergy and Immunology

## 2016-01-23 DIAGNOSIS — E1140 Type 2 diabetes mellitus with diabetic neuropathy, unspecified: Secondary | ICD-10-CM | POA: Diagnosis not present

## 2016-01-23 DIAGNOSIS — E1142 Type 2 diabetes mellitus with diabetic polyneuropathy: Secondary | ICD-10-CM | POA: Diagnosis not present

## 2016-01-29 ENCOUNTER — Ambulatory Visit (HOSPITAL_COMMUNITY)
Admission: RE | Admit: 2016-01-29 | Discharge: 2016-01-29 | Disposition: A | Discharge status: Home / Self Care | Payer: Medicare Other | Source: Ambulatory Visit | Attending: Family Medicine | Admitting: Family Medicine

## 2016-01-29 DIAGNOSIS — Z1231 Encounter for screening mammogram for malignant neoplasm of breast: Secondary | ICD-10-CM

## 2016-03-07 DIAGNOSIS — E118 Type 2 diabetes mellitus with unspecified complications: Secondary | ICD-10-CM | POA: Diagnosis not present

## 2016-03-07 DIAGNOSIS — I10 Essential (primary) hypertension: Secondary | ICD-10-CM | POA: Diagnosis not present

## 2016-03-07 DIAGNOSIS — E039 Hypothyroidism, unspecified: Secondary | ICD-10-CM | POA: Diagnosis not present

## 2016-03-07 DIAGNOSIS — J301 Allergic rhinitis due to pollen: Secondary | ICD-10-CM | POA: Diagnosis not present

## 2016-03-07 DIAGNOSIS — M130 Polyarthritis, unspecified: Secondary | ICD-10-CM | POA: Diagnosis not present

## 2016-04-24 DIAGNOSIS — E1142 Type 2 diabetes mellitus with diabetic polyneuropathy: Secondary | ICD-10-CM | POA: Diagnosis not present

## 2016-04-24 DIAGNOSIS — E1140 Type 2 diabetes mellitus with diabetic neuropathy, unspecified: Secondary | ICD-10-CM | POA: Diagnosis not present

## 2016-05-29 DIAGNOSIS — E119 Type 2 diabetes mellitus without complications: Secondary | ICD-10-CM | POA: Diagnosis not present

## 2016-06-24 DIAGNOSIS — M79672 Pain in left foot: Secondary | ICD-10-CM | POA: Diagnosis not present

## 2016-06-24 DIAGNOSIS — M25579 Pain in unspecified ankle and joints of unspecified foot: Secondary | ICD-10-CM | POA: Diagnosis not present

## 2016-06-24 DIAGNOSIS — M79675 Pain in left toe(s): Secondary | ICD-10-CM | POA: Diagnosis not present

## 2016-07-05 DIAGNOSIS — E039 Hypothyroidism, unspecified: Secondary | ICD-10-CM | POA: Diagnosis not present

## 2016-07-05 DIAGNOSIS — I10 Essential (primary) hypertension: Secondary | ICD-10-CM | POA: Diagnosis not present

## 2016-07-05 DIAGNOSIS — E782 Mixed hyperlipidemia: Secondary | ICD-10-CM | POA: Diagnosis not present

## 2016-07-05 DIAGNOSIS — E118 Type 2 diabetes mellitus with unspecified complications: Secondary | ICD-10-CM | POA: Diagnosis not present

## 2016-07-05 DIAGNOSIS — M130 Polyarthritis, unspecified: Secondary | ICD-10-CM | POA: Diagnosis not present

## 2016-07-16 DIAGNOSIS — E1140 Type 2 diabetes mellitus with diabetic neuropathy, unspecified: Secondary | ICD-10-CM | POA: Diagnosis not present

## 2016-07-16 DIAGNOSIS — E1142 Type 2 diabetes mellitus with diabetic polyneuropathy: Secondary | ICD-10-CM | POA: Diagnosis not present

## 2016-09-19 ENCOUNTER — Ambulatory Visit: Payer: Medicare Other | Admitting: Allergy

## 2016-10-01 DIAGNOSIS — E1140 Type 2 diabetes mellitus with diabetic neuropathy, unspecified: Secondary | ICD-10-CM | POA: Diagnosis not present

## 2016-10-01 DIAGNOSIS — E1142 Type 2 diabetes mellitus with diabetic polyneuropathy: Secondary | ICD-10-CM | POA: Diagnosis not present

## 2016-11-08 DIAGNOSIS — Z79899 Other long term (current) drug therapy: Secondary | ICD-10-CM | POA: Diagnosis not present

## 2016-11-08 DIAGNOSIS — E119 Type 2 diabetes mellitus without complications: Secondary | ICD-10-CM | POA: Diagnosis not present

## 2016-11-08 DIAGNOSIS — I10 Essential (primary) hypertension: Secondary | ICD-10-CM | POA: Diagnosis not present

## 2016-12-16 ENCOUNTER — Other Ambulatory Visit (HOSPITAL_COMMUNITY): Payer: Self-pay | Admitting: Family Medicine

## 2016-12-16 DIAGNOSIS — Z1231 Encounter for screening mammogram for malignant neoplasm of breast: Secondary | ICD-10-CM

## 2016-12-16 DIAGNOSIS — J209 Acute bronchitis, unspecified: Secondary | ICD-10-CM | POA: Diagnosis not present

## 2017-01-29 ENCOUNTER — Ambulatory Visit (HOSPITAL_COMMUNITY)
Admission: RE | Admit: 2017-01-29 | Discharge: 2017-01-29 | Disposition: A | Discharge status: Home / Self Care | Payer: Medicare HMO | Source: Ambulatory Visit | Attending: Family Medicine | Admitting: Family Medicine

## 2017-01-29 DIAGNOSIS — Z1231 Encounter for screening mammogram for malignant neoplasm of breast: Secondary | ICD-10-CM | POA: Diagnosis not present

## 2017-02-07 DIAGNOSIS — I10 Essential (primary) hypertension: Secondary | ICD-10-CM | POA: Diagnosis not present

## 2017-02-07 DIAGNOSIS — E782 Mixed hyperlipidemia: Secondary | ICD-10-CM | POA: Diagnosis not present

## 2017-02-07 DIAGNOSIS — E119 Type 2 diabetes mellitus without complications: Secondary | ICD-10-CM | POA: Diagnosis not present

## 2017-02-07 DIAGNOSIS — E039 Hypothyroidism, unspecified: Secondary | ICD-10-CM | POA: Diagnosis not present

## 2017-05-09 DIAGNOSIS — L2089 Other atopic dermatitis: Secondary | ICD-10-CM | POA: Diagnosis not present

## 2017-05-09 DIAGNOSIS — J219 Acute bronchiolitis, unspecified: Secondary | ICD-10-CM | POA: Diagnosis not present

## 2017-05-23 DIAGNOSIS — E782 Mixed hyperlipidemia: Secondary | ICD-10-CM | POA: Diagnosis not present

## 2017-05-23 DIAGNOSIS — M130 Polyarthritis, unspecified: Secondary | ICD-10-CM | POA: Diagnosis not present

## 2017-05-23 DIAGNOSIS — I10 Essential (primary) hypertension: Secondary | ICD-10-CM | POA: Diagnosis not present

## 2017-05-23 DIAGNOSIS — E119 Type 2 diabetes mellitus without complications: Secondary | ICD-10-CM | POA: Diagnosis not present

## 2017-05-27 DIAGNOSIS — S80861A Insect bite (nonvenomous), right lower leg, initial encounter: Secondary | ICD-10-CM | POA: Diagnosis not present

## 2017-05-27 DIAGNOSIS — L308 Other specified dermatitis: Secondary | ICD-10-CM | POA: Diagnosis not present

## 2017-05-27 DIAGNOSIS — S80862A Insect bite (nonvenomous), left lower leg, initial encounter: Secondary | ICD-10-CM | POA: Diagnosis not present

## 2017-05-30 DIAGNOSIS — E119 Type 2 diabetes mellitus without complications: Secondary | ICD-10-CM | POA: Diagnosis not present

## 2017-05-30 DIAGNOSIS — H524 Presbyopia: Secondary | ICD-10-CM | POA: Diagnosis not present

## 2017-06-03 DIAGNOSIS — L308 Other specified dermatitis: Secondary | ICD-10-CM | POA: Diagnosis not present

## 2017-06-03 DIAGNOSIS — D170 Benign lipomatous neoplasm of skin and subcutaneous tissue of head, face and neck: Secondary | ICD-10-CM | POA: Diagnosis not present

## 2017-06-03 DIAGNOSIS — L680 Hirsutism: Secondary | ICD-10-CM | POA: Diagnosis not present

## 2017-09-01 DIAGNOSIS — T7840XA Allergy, unspecified, initial encounter: Secondary | ICD-10-CM | POA: Diagnosis not present

## 2017-09-01 DIAGNOSIS — E782 Mixed hyperlipidemia: Secondary | ICD-10-CM | POA: Diagnosis not present

## 2017-09-01 DIAGNOSIS — E039 Hypothyroidism, unspecified: Secondary | ICD-10-CM | POA: Diagnosis not present

## 2017-09-01 DIAGNOSIS — I10 Essential (primary) hypertension: Secondary | ICD-10-CM | POA: Diagnosis not present

## 2017-09-01 DIAGNOSIS — E119 Type 2 diabetes mellitus without complications: Secondary | ICD-10-CM | POA: Diagnosis not present

## 2017-09-08 DIAGNOSIS — R69 Illness, unspecified: Secondary | ICD-10-CM | POA: Diagnosis not present

## 2017-12-03 ENCOUNTER — Other Ambulatory Visit (HOSPITAL_COMMUNITY): Payer: Self-pay | Admitting: Family Medicine

## 2017-12-03 DIAGNOSIS — Z1231 Encounter for screening mammogram for malignant neoplasm of breast: Secondary | ICD-10-CM

## 2017-12-05 DIAGNOSIS — I10 Essential (primary) hypertension: Secondary | ICD-10-CM | POA: Diagnosis not present

## 2017-12-05 DIAGNOSIS — J069 Acute upper respiratory infection, unspecified: Secondary | ICD-10-CM | POA: Diagnosis not present

## 2017-12-05 DIAGNOSIS — E118 Type 2 diabetes mellitus with unspecified complications: Secondary | ICD-10-CM | POA: Diagnosis not present

## 2017-12-05 DIAGNOSIS — E0800 Diabetes mellitus due to underlying condition with hyperosmolarity without nonketotic hyperglycemic-hyperosmolar coma (NKHHC): Secondary | ICD-10-CM | POA: Diagnosis not present

## 2018-01-30 ENCOUNTER — Ambulatory Visit (HOSPITAL_COMMUNITY)
Admission: RE | Admit: 2018-01-30 | Discharge: 2018-01-30 | Disposition: A | Discharge status: Home / Self Care | Payer: Medicare HMO | Source: Ambulatory Visit | Attending: Family Medicine | Admitting: Family Medicine

## 2018-01-30 ENCOUNTER — Encounter (HOSPITAL_COMMUNITY): Payer: Self-pay

## 2018-01-30 DIAGNOSIS — Z1231 Encounter for screening mammogram for malignant neoplasm of breast: Secondary | ICD-10-CM | POA: Diagnosis not present

## 2018-02-19 DIAGNOSIS — Z0000 Encounter for general adult medical examination without abnormal findings: Secondary | ICD-10-CM | POA: Diagnosis not present

## 2018-02-19 DIAGNOSIS — M069 Rheumatoid arthritis, unspecified: Secondary | ICD-10-CM | POA: Diagnosis not present

## 2018-03-24 DIAGNOSIS — J301 Allergic rhinitis due to pollen: Secondary | ICD-10-CM | POA: Diagnosis not present

## 2018-03-24 DIAGNOSIS — E089 Diabetes mellitus due to underlying condition without complications: Secondary | ICD-10-CM | POA: Diagnosis not present

## 2018-03-24 DIAGNOSIS — I10 Essential (primary) hypertension: Secondary | ICD-10-CM | POA: Diagnosis not present

## 2018-03-24 DIAGNOSIS — R21 Rash and other nonspecific skin eruption: Secondary | ICD-10-CM | POA: Diagnosis not present

## 2018-04-14 DIAGNOSIS — I10 Essential (primary) hypertension: Secondary | ICD-10-CM | POA: Diagnosis not present

## 2018-04-14 DIAGNOSIS — E119 Type 2 diabetes mellitus without complications: Secondary | ICD-10-CM | POA: Diagnosis not present

## 2018-06-03 DIAGNOSIS — H524 Presbyopia: Secondary | ICD-10-CM | POA: Diagnosis not present

## 2018-06-03 DIAGNOSIS — E119 Type 2 diabetes mellitus without complications: Secondary | ICD-10-CM | POA: Diagnosis not present

## 2018-07-08 DIAGNOSIS — E782 Mixed hyperlipidemia: Secondary | ICD-10-CM | POA: Diagnosis not present

## 2018-07-08 DIAGNOSIS — E119 Type 2 diabetes mellitus without complications: Secondary | ICD-10-CM | POA: Diagnosis not present

## 2018-07-08 DIAGNOSIS — I10 Essential (primary) hypertension: Secondary | ICD-10-CM | POA: Diagnosis not present

## 2018-07-14 DIAGNOSIS — I10 Essential (primary) hypertension: Secondary | ICD-10-CM | POA: Diagnosis not present

## 2018-07-14 DIAGNOSIS — E039 Hypothyroidism, unspecified: Secondary | ICD-10-CM | POA: Diagnosis not present

## 2018-07-14 DIAGNOSIS — E119 Type 2 diabetes mellitus without complications: Secondary | ICD-10-CM | POA: Diagnosis not present

## 2018-09-14 DIAGNOSIS — E119 Type 2 diabetes mellitus without complications: Secondary | ICD-10-CM | POA: Diagnosis not present

## 2018-09-14 DIAGNOSIS — H60543 Acute eczematoid otitis externa, bilateral: Secondary | ICD-10-CM | POA: Diagnosis not present

## 2018-09-14 DIAGNOSIS — I10 Essential (primary) hypertension: Secondary | ICD-10-CM | POA: Diagnosis not present

## 2018-09-14 DIAGNOSIS — M130 Polyarthritis, unspecified: Secondary | ICD-10-CM | POA: Diagnosis not present

## 2018-11-12 DIAGNOSIS — E039 Hypothyroidism, unspecified: Secondary | ICD-10-CM | POA: Diagnosis not present

## 2018-11-12 DIAGNOSIS — E119 Type 2 diabetes mellitus without complications: Secondary | ICD-10-CM | POA: Diagnosis not present

## 2018-11-12 DIAGNOSIS — E034 Atrophy of thyroid (acquired): Secondary | ICD-10-CM | POA: Diagnosis not present

## 2018-11-12 DIAGNOSIS — I10 Essential (primary) hypertension: Secondary | ICD-10-CM | POA: Diagnosis not present

## 2018-11-12 DIAGNOSIS — Z6832 Body mass index (BMI) 32.0-32.9, adult: Secondary | ICD-10-CM | POA: Diagnosis not present

## 2018-12-28 ENCOUNTER — Other Ambulatory Visit (HOSPITAL_COMMUNITY): Payer: Self-pay | Admitting: Family Medicine

## 2018-12-28 DIAGNOSIS — Z1231 Encounter for screening mammogram for malignant neoplasm of breast: Secondary | ICD-10-CM

## 2018-12-30 DIAGNOSIS — E118 Type 2 diabetes mellitus with unspecified complications: Secondary | ICD-10-CM | POA: Diagnosis not present

## 2018-12-30 DIAGNOSIS — I10 Essential (primary) hypertension: Secondary | ICD-10-CM | POA: Diagnosis not present

## 2018-12-31 ENCOUNTER — Other Ambulatory Visit (HOSPITAL_COMMUNITY): Payer: Self-pay | Admitting: Family Medicine

## 2018-12-31 ENCOUNTER — Ambulatory Visit (HOSPITAL_COMMUNITY)
Admission: RE | Admit: 2018-12-31 | Discharge: 2018-12-31 | Disposition: A | Discharge status: Home / Self Care | Payer: Medicare HMO | Source: Ambulatory Visit | Attending: Family Medicine | Admitting: Family Medicine

## 2018-12-31 DIAGNOSIS — R52 Pain, unspecified: Secondary | ICD-10-CM

## 2018-12-31 DIAGNOSIS — M545 Low back pain: Secondary | ICD-10-CM | POA: Diagnosis not present

## 2018-12-31 DIAGNOSIS — M25551 Pain in right hip: Secondary | ICD-10-CM | POA: Diagnosis not present

## 2018-12-31 DIAGNOSIS — M1611 Unilateral primary osteoarthritis, right hip: Secondary | ICD-10-CM | POA: Diagnosis not present

## 2018-12-31 DIAGNOSIS — M4316 Spondylolisthesis, lumbar region: Secondary | ICD-10-CM | POA: Diagnosis not present

## 2019-01-27 DIAGNOSIS — E785 Hyperlipidemia, unspecified: Secondary | ICD-10-CM | POA: Diagnosis not present

## 2019-01-27 DIAGNOSIS — E1169 Type 2 diabetes mellitus with other specified complication: Secondary | ICD-10-CM | POA: Diagnosis not present

## 2019-01-27 DIAGNOSIS — M130 Polyarthritis, unspecified: Secondary | ICD-10-CM | POA: Diagnosis not present

## 2019-02-03 ENCOUNTER — Ambulatory Visit (HOSPITAL_COMMUNITY)
Admission: RE | Admit: 2019-02-03 | Discharge: 2019-02-03 | Disposition: A | Discharge status: Home / Self Care | Payer: Medicare HMO | Source: Ambulatory Visit | Attending: Family Medicine | Admitting: Family Medicine

## 2019-02-03 DIAGNOSIS — Z1231 Encounter for screening mammogram for malignant neoplasm of breast: Secondary | ICD-10-CM | POA: Diagnosis not present

## 2019-05-12 DIAGNOSIS — I10 Essential (primary) hypertension: Secondary | ICD-10-CM | POA: Diagnosis not present

## 2019-05-12 DIAGNOSIS — E1169 Type 2 diabetes mellitus with other specified complication: Secondary | ICD-10-CM | POA: Diagnosis not present

## 2019-05-12 DIAGNOSIS — E782 Mixed hyperlipidemia: Secondary | ICD-10-CM | POA: Diagnosis not present

## 2019-07-27 DIAGNOSIS — I10 Essential (primary) hypertension: Secondary | ICD-10-CM | POA: Diagnosis not present

## 2019-07-30 ENCOUNTER — Other Ambulatory Visit: Payer: Self-pay

## 2019-07-30 ENCOUNTER — Ambulatory Visit: Payer: Medicare HMO | Admitting: Podiatry

## 2019-07-30 VITALS — Temp 97.2°F

## 2019-07-30 DIAGNOSIS — M79676 Pain in unspecified toe(s): Secondary | ICD-10-CM | POA: Diagnosis not present

## 2019-07-30 DIAGNOSIS — L600 Ingrowing nail: Secondary | ICD-10-CM | POA: Diagnosis not present

## 2019-07-30 MED ORDER — NEOMYCIN-POLYMYXIN-HC 3.5-10000-1 OT SOLN: OTIC | 0 refills | Status: DC

## 2019-07-30 NOTE — Patient Instructions (Signed)

## 2019-08-12 ENCOUNTER — Ambulatory Visit: Payer: Medicare HMO

## 2019-08-12 ENCOUNTER — Other Ambulatory Visit: Payer: Self-pay

## 2019-08-12 DIAGNOSIS — L600 Ingrowing nail: Secondary | ICD-10-CM

## 2019-08-12 NOTE — Patient Instructions (Signed)

## 2019-08-16 NOTE — Progress Notes (Signed)
Subjective:  Patient ID: Valerie Thomas, female    DOB: [birth year],  MRN: [id number]  Chief Complaint  Patient presents with  . Nail Problem    Pt states left 1st lateral nail is ingrown, 1 year duration, tender to touch, denies drainage, denies fever/nausea/vomiting/chills    73 y.o. female presents with the above complaint. Hx as above.   Review of Systems: Negative except as noted in the HPI. Denies N/V/F/Ch.  Past Medical History:  Diagnosis Date  . Acid reflux   . Asthma   . Diabetes mellitus without complication ([place])   . Eczema   . Hypercholesteremia   . Hypertension   . Thyroid disease     Current Outpatient Medications:  .  ACCU-CHEK AVIVA PLUS test strip, , Disp: , Rfl:  .  Accu-Chek Softclix Lancets lancets, , Disp: , Rfl:  .  albuterol (PROVENTIL HFA;VENTOLIN HFA) 108 (90 BASE) MCG/ACT inhaler, Inhale 2 puffs into the lungs every 6 (six) hours as needed for wheezing., Disp: , Rfl:  .  amLODipine (NORVASC) 10 MG tablet, Take 10 mg by mouth daily., Disp: , Rfl:  .  aspirin EC 81 MG tablet, Take 81 mg by mouth daily., Disp: , Rfl:  .  bisoprolol-hydrochlorothiazide (ZIAC) 5-6.25 MG per tablet, Take 1 tablet by mouth daily., Disp: , Rfl:  .  Blood Glucose Monitoring Suppl (ACCU-CHEK AVIVA PLUS) w/Device KIT, , Disp: , Rfl:  .  budesonide-formoterol (SYMBICORT) 160-4.5 MCG/ACT inhaler, Inhale 2 puffs into the lungs 2 (two) times daily., Disp: , Rfl:  .  Cod Liver Oil CAPS, Take 1 capsule by mouth daily., Disp: , Rfl:  .  EPINEPHrine (EPIPEN 2-PAK) 0.3 mg/0.3 mL IJ SOAJ injection, Inject 0.3 mLs (0.3 mg total) into the muscle once., Disp: 2 Device, Rfl: 1 .  fluticasone (FLOVENT HFA) 110 MCG/ACT inhaler, Inhale 2 puffs into the lungs daily., Disp: 1 Inhaler, Rfl: 3 .  gabapentin (NEURONTIN) 300 MG capsule, Take 300 mg by mouth 2 (two) times daily., Disp: , Rfl:  .  glyBURIDE-metformin (GLUCOVANCE) 1.25-250 MG per tablet, Take 1 tablet by mouth daily with breakfast., Disp:  , Rfl:  .  HYDROcodone-acetaminophen (NORCO/VICODIN) 5-325 MG per tablet, Take 1 tablet by mouth every 4 (four) hours as needed for pain., Disp: 20 tablet, Rfl: 0 .  JANUVIA 100 MG tablet, , Disp: , Rfl:  .  levothyroxine (SYNTHROID, LEVOTHROID) 100 MCG tablet, Take 100 mcg by mouth daily before breakfast., Disp: , Rfl:  .  losartan-hydrochlorothiazide (HYZAAR) 100-25 MG per tablet, Take 1 tablet by mouth daily., Disp: , Rfl:  .  metFORMIN (GLUCOPHAGE) 500 MG tablet, Take 500 mg by mouth 2 (two) times daily with a meal., Disp: , Rfl:  .  montelukast (SINGULAIR) 10 MG tablet, Take 10 mg by mouth daily., Disp: , Rfl:  .  neomycin-polymyxin-hydrocortisone (CORTISPORIN) OTIC solution, Apply 2 drops to the ingrown toenail site twice daily. Cover with band-aid., Disp: 10 mL, Rfl: 0 .  omeprazole (PRILOSEC) 40 MG capsule, Take 40 mg by mouth daily., Disp: , Rfl:  .  penicillin v potassium (VEETID) 500 MG tablet, Take 1 tablet (500 mg total) by mouth 3 (three) times daily. (Patient not taking: Reported on 10/31/2015), Disp: 15 tablet, Rfl: 0 .  Polyethyl Glycol-Propyl Glycol (SYSTANE OP), Apply 1 drop to eye daily as needed (dry eyes)., Disp: , Rfl:  .  potassium chloride SA (K-DUR,KLOR-CON) 20 MEQ tablet, Take 20 mEq by mouth 2 (two) times daily., Disp: , Rfl:  .  simvastatin (ZOCOR) 40 MG tablet, Take 40 mg by mouth daily., Disp: , Rfl:  .  sodium chloride (OCEAN) 0.65 % nasal spray, Place 1 spray into the nose as needed for congestion., Disp: , Rfl:  .  sulindac (CLINORIL) 200 MG tablet, Take 200 mg by mouth 2 (two) times daily., Disp: , Rfl:   Social History   Tobacco Use  Smoking Status Never Smoker    No Known Allergies Objective:   Vitals:   07/30/19 1426  Temp: (!) 97.2 F (36.2 C)   There is no height or weight on file to calculate BMI. Constitutional Well developed. Well nourished.  Vascular Dorsalis pedis pulses palpable bilaterally. Posterior tibial pulses palpable bilaterally.  Capillary refill normal to all digits.  No cyanosis or clubbing noted. Pedal hair growth normal.  Neurologic Normal speech. Oriented to person, place, and time. Epicritic sensation to light touch grossly present bilaterally.  Dermatologic Painful ingrowing nail at lateral nail borders of the hallux nail left. No other open wounds. No skin lesions.  Orthopedic: Normal joint ROM without pain or crepitus bilaterally. No visible deformities. No bony tenderness.   Radiographs: None Assessment:   1. Ingrown nail   2. Pain around toenail    Plan:  Patient was evaluated and treated and all questions answered.  Ingrown Nail, left -Patient elects to proceed with minor surgery to remove ingrown toenail removal today. Consent reviewed and signed by patient. -Ingrown nail excised. See procedure note. -Educated on post-procedure care including soaking. Written instructions provided and reviewed. -Patient to follow up in 2 weeks for nail check.  Procedure: Excision of Ingrown Toenail Location: Left 1st toe lateral nail borders. Anesthesia: Lidocaine 1% plain; 4.5 mL and Marcaine 0.5% plain; 1.5 mL, digital block. Skin Prep: Betadine. Dressing: Silvadene; telfa; dry, sterile, compression dressing. Technique: Following skin prep, the toe was exsanguinated and a tourniquet was secured at the base of the toe. The affected nail border was freed, split with a nail splitter, and excised. Chemical matrixectomy was then performed with phenol and irrigated out with alcohol. The tourniquet was then removed and sterile dressing applied. Disposition: Patient tolerated procedure well. Patient to return in 2 weeks for follow-up.   Return in about 2 weeks (around 08/13/2019) for Nail Check with Nurse.

## 2019-08-20 ENCOUNTER — Telehealth: Payer: Self-pay | Admitting: Podiatry

## 2019-08-20 NOTE — Telephone Encounter (Signed)
Pt had an ingrown toenail removed on 07/30/19 and has some questions she would like to discuss with the nurse. Please give patient a call.

## 2019-08-20 NOTE — Telephone Encounter (Signed)
I called pt, pt states Monday she stumped to but it didn't hurt, but Wednesday it hurt, is that normal. I told pt it was not unusual for a already irritated toe to hurt after being stumped and as long as there was not bleeding and oozing, or pain that should be fine. Pt asked if she should finish this week of soaks and ointment as instructed. I told pt that was a good idea.

## 2019-09-03 NOTE — Progress Notes (Signed)
Patient is here today for follow-up appointment, recent procedure performed on 07/30/2019, removal of ingrown toenail.  She states she has a little bit of soreness in the area, but overall she feels like it is healing well.  No redness, no swelling, no erythema, no drainage, no other signs and symptoms of infection.  The area appears to be healing well.  Discussed signs and symptoms of infection with patient, verbal and written instructions were given.  She is to follow-up as needed with any acute symptom changes.

## 2019-09-13 ENCOUNTER — Telehealth: Payer: Self-pay | Admitting: Podiatry

## 2019-09-13 DIAGNOSIS — M130 Polyarthritis, unspecified: Secondary | ICD-10-CM | POA: Diagnosis not present

## 2019-09-13 DIAGNOSIS — I10 Essential (primary) hypertension: Secondary | ICD-10-CM | POA: Diagnosis not present

## 2019-09-13 DIAGNOSIS — E208 Other hypoparathyroidism: Secondary | ICD-10-CM | POA: Diagnosis not present

## 2019-09-13 DIAGNOSIS — E1169 Type 2 diabetes mellitus with other specified complication: Secondary | ICD-10-CM | POA: Diagnosis not present

## 2019-09-15 NOTE — Telephone Encounter (Signed)
My PCP, Dr. Lucianne Lei referred me to your office and I was seen on 07/30/2019. She has not received those ov notes and would like them faxed to her. If you need to speak to me, I can be reached at [phone].

## 2019-09-15 NOTE — Telephone Encounter (Signed)
Called Valerie Thomas to let her know that I just faxed her medical records from her visit on 07/30/2019 to Dr. Criss Rosales since they referred her to [place] for her appointment. Told Valerie Thomas to call us if she had any questions or needed anything else.

## 2019-12-14 DIAGNOSIS — I10 Essential (primary) hypertension: Secondary | ICD-10-CM | POA: Diagnosis not present

## 2019-12-17 ENCOUNTER — Other Ambulatory Visit (HOSPITAL_COMMUNITY): Payer: Self-pay | Admitting: Family Medicine

## 2019-12-17 DIAGNOSIS — Z1231 Encounter for screening mammogram for malignant neoplasm of breast: Secondary | ICD-10-CM

## 2019-12-21 DIAGNOSIS — Z0000 Encounter for general adult medical examination without abnormal findings: Secondary | ICD-10-CM | POA: Diagnosis not present

## 2020-01-02 DIAGNOSIS — E1169 Type 2 diabetes mellitus with other specified complication: Secondary | ICD-10-CM | POA: Diagnosis not present

## 2020-01-02 DIAGNOSIS — E039 Hypothyroidism, unspecified: Secondary | ICD-10-CM | POA: Diagnosis not present

## 2020-01-02 DIAGNOSIS — M130 Polyarthritis, unspecified: Secondary | ICD-10-CM | POA: Diagnosis not present

## 2020-01-02 DIAGNOSIS — I10 Essential (primary) hypertension: Secondary | ICD-10-CM | POA: Diagnosis not present

## 2020-01-04 DIAGNOSIS — Z0000 Encounter for general adult medical examination without abnormal findings: Secondary | ICD-10-CM | POA: Diagnosis not present

## 2020-01-14 DIAGNOSIS — E039 Hypothyroidism, unspecified: Secondary | ICD-10-CM | POA: Diagnosis not present

## 2020-01-14 DIAGNOSIS — M130 Polyarthritis, unspecified: Secondary | ICD-10-CM | POA: Diagnosis not present

## 2020-01-14 DIAGNOSIS — I10 Essential (primary) hypertension: Secondary | ICD-10-CM | POA: Diagnosis not present

## 2020-01-14 DIAGNOSIS — E1169 Type 2 diabetes mellitus with other specified complication: Secondary | ICD-10-CM | POA: Diagnosis not present

## 2020-01-30 DIAGNOSIS — I10 Essential (primary) hypertension: Secondary | ICD-10-CM | POA: Diagnosis not present

## 2020-01-30 DIAGNOSIS — E039 Hypothyroidism, unspecified: Secondary | ICD-10-CM | POA: Diagnosis not present

## 2020-01-30 DIAGNOSIS — M130 Polyarthritis, unspecified: Secondary | ICD-10-CM | POA: Diagnosis not present

## 2020-01-30 DIAGNOSIS — E1169 Type 2 diabetes mellitus with other specified complication: Secondary | ICD-10-CM | POA: Diagnosis not present

## 2020-02-11 ENCOUNTER — Ambulatory Visit (HOSPITAL_COMMUNITY): Payer: Medicare HMO

## 2020-03-01 ENCOUNTER — Ambulatory Visit (HOSPITAL_COMMUNITY)
Admission: RE | Admit: 2020-03-01 | Discharge: 2020-03-01 | Disposition: A | Discharge status: Home / Self Care | Payer: Medicare HMO | Source: Ambulatory Visit | Attending: Family Medicine | Admitting: Family Medicine

## 2020-03-01 ENCOUNTER — Other Ambulatory Visit: Payer: Self-pay

## 2020-03-01 DIAGNOSIS — Z1231 Encounter for screening mammogram for malignant neoplasm of breast: Secondary | ICD-10-CM | POA: Diagnosis not present

## 2020-03-08 ENCOUNTER — Other Ambulatory Visit (HOSPITAL_COMMUNITY): Payer: Self-pay | Admitting: Family Medicine

## 2020-03-08 DIAGNOSIS — R928 Other abnormal and inconclusive findings on diagnostic imaging of breast: Secondary | ICD-10-CM

## 2020-03-15 DIAGNOSIS — E669 Obesity, unspecified: Secondary | ICD-10-CM | POA: Diagnosis not present

## 2020-03-15 DIAGNOSIS — I10 Essential (primary) hypertension: Secondary | ICD-10-CM | POA: Diagnosis not present

## 2020-03-15 DIAGNOSIS — E1169 Type 2 diabetes mellitus with other specified complication: Secondary | ICD-10-CM | POA: Diagnosis not present

## 2020-03-21 ENCOUNTER — Other Ambulatory Visit: Payer: Self-pay

## 2020-03-21 ENCOUNTER — Ambulatory Visit (HOSPITAL_COMMUNITY)
Admission: RE | Admit: 2020-03-21 | Discharge: 2020-03-21 | Disposition: A | Discharge status: Home / Self Care | Payer: Medicare HMO | Source: Ambulatory Visit | Attending: Family Medicine | Admitting: Family Medicine

## 2020-03-21 DIAGNOSIS — R928 Other abnormal and inconclusive findings on diagnostic imaging of breast: Secondary | ICD-10-CM | POA: Diagnosis not present

## 2020-04-20 DIAGNOSIS — H66001 Acute suppurative otitis media without spontaneous rupture of ear drum, right ear: Secondary | ICD-10-CM | POA: Diagnosis not present

## 2020-05-16 DIAGNOSIS — I10 Essential (primary) hypertension: Secondary | ICD-10-CM | POA: Diagnosis not present

## 2020-05-16 DIAGNOSIS — M130 Polyarthritis, unspecified: Secondary | ICD-10-CM | POA: Diagnosis not present

## 2020-05-16 DIAGNOSIS — E1169 Type 2 diabetes mellitus with other specified complication: Secondary | ICD-10-CM | POA: Diagnosis not present

## 2020-05-16 DIAGNOSIS — E039 Hypothyroidism, unspecified: Secondary | ICD-10-CM | POA: Diagnosis not present

## 2020-05-16 DIAGNOSIS — E785 Hyperlipidemia, unspecified: Secondary | ICD-10-CM | POA: Diagnosis not present

## 2020-06-01 ENCOUNTER — Encounter (INDEPENDENT_AMBULATORY_CARE_PROVIDER_SITE_OTHER): Payer: Self-pay

## 2020-06-01 ENCOUNTER — Ambulatory Visit (HOSPITAL_COMMUNITY): Payer: Medicare HMO | Attending: Family Medicine | Admitting: Physical Therapy

## 2020-06-01 ENCOUNTER — Other Ambulatory Visit: Payer: Self-pay

## 2020-06-01 ENCOUNTER — Encounter (HOSPITAL_COMMUNITY): Payer: Self-pay

## 2020-06-01 DIAGNOSIS — M545 Low back pain: Secondary | ICD-10-CM | POA: Insufficient documentation

## 2020-06-01 DIAGNOSIS — R29898 Other symptoms and signs involving the musculoskeletal system: Secondary | ICD-10-CM | POA: Insufficient documentation

## 2020-06-01 DIAGNOSIS — M6281 Muscle weakness (generalized): Secondary | ICD-10-CM | POA: Insufficient documentation

## 2020-06-22 ENCOUNTER — Encounter (HOSPITAL_COMMUNITY): Payer: Self-pay | Admitting: Physical Therapy

## 2020-06-22 ENCOUNTER — Other Ambulatory Visit: Payer: Self-pay

## 2020-06-22 ENCOUNTER — Ambulatory Visit (HOSPITAL_COMMUNITY): Payer: Medicare HMO | Admitting: Physical Therapy

## 2020-06-22 DIAGNOSIS — M545 Low back pain, unspecified: Secondary | ICD-10-CM

## 2020-06-22 DIAGNOSIS — M6281 Muscle weakness (generalized): Secondary | ICD-10-CM | POA: Diagnosis not present

## 2020-06-22 DIAGNOSIS — R29898 Other symptoms and signs involving the musculoskeletal system: Secondary | ICD-10-CM | POA: Diagnosis not present

## 2020-06-22 NOTE — Patient Instructions (Signed)
Knee Roll    Lying on back, with knees bent and feet flat on floor, arms outstretched to sides, slowly roll both knees to side, hold 5 seconds. Back to starting position, hold 5 seconds. Then to opposite side, hold 5 seconds. Return to starting position. Keep shoulders and arms in contact with floor. Repeat 10 times.    Copyright  VHI. All rights reserved.   

## 2020-06-22 NOTE — Therapy (Signed)
[place], [place], [address] Phone: [phone]   Fax:  [phone]  Physical Therapy Evaluation  Patient Details  Name: Valerie Thomas MRN: [id number] Date of Birth: [birth year] Referring Provider (PT): Elyn Peers, MD   Encounter Date: 06/22/2020   PT End of Session - 06/22/20 1001    Visit Number 1    Number of Visits 5    Date for PT Re-Evaluation 07/20/20    Authorization Type [employer]    Authorization Time Period Visits requested Check approval    Authorization - Visit Number 0    Authorization - Number of Visits 4    Progress Note Due on Visit 5    PT Start Time 0945    PT Stop Time 1030    PT Time Calculation (min) 45 min    Activity Tolerance Patient tolerated treatment well    Behavior During Therapy [facility] for tasks assessed/performed           Past Medical History:  Diagnosis Date  . Acid reflux   . Asthma   . Diabetes mellitus without complication ([place])   . Eczema   . Hypercholesteremia   . Hypertension   . Thyroid disease     Past Surgical History:  Procedure Laterality Date  . ABDOMINAL HYSTERECTOMY    . HAND SURGERY    . THYROID SURGERY    . torn ligament     neck surgery   . UTERINE FIBROID SURGERY      There were no vitals filed for this visit.    Subjective Assessment - 06/22/20 0950    Subjective Patient reported that she feels like her hips and back get stiff. She said it happens when she sits. She reported that she did go to the MD and got some medication and she's not having as much pain, but still has a lot of stiffness. She reported that her right leg feels like it wants to give out sometimes. Denied any recent tingling or numbness. She denied any changes in her bowel or bladder function, however, she did Patient report occasionally having difficulty making it to the bathroom when she urinates.  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 Patient reported that it would hurt to turn over in bed last year, but that since getting the medicine she has been feeling better. She stated that she used a cane when she was really feeling bad, but that since she has been taking the medication she has not needed it. 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    Pertinent History Insidious onset of LBP    Limitations House hold activities;Standing;Walking    How long can you sit comfortably? Not limited    How long can you stand comfortably? 5 minutes    How long can you walk comfortably? 5 minutes notices a difference in her legs    Diagnostic tests X-rays: Lumbar, SIJ, HIP    Patient Stated Goals Strengthening particularly her legs to help her get her housework done    Currently in Pain? No/denies              [facility] PT Assessment - 06/22/20 0001      Assessment   Medical Diagnosis LBP    Referring Provider (PT) Elyn Peers, MD    Onset Date/Surgical Date --   2 years   Next MD Visit --  Sometime August 2021   Prior Therapy Yes, for torn rotator cuff      Precautions   Precautions None      Restrictions   Weight Bearing Restrictions No      Balance Screen   Has the patient fallen in the past 6 months No      [place] residence    Type of [place] to enter    Entrance Stairs-Number of Steps 4    Entrance Stairs-Rails Can reach both    [place] One level    [place] - single point      Prior Function   Level of [place] Retired      Associate Professor   Overall Cognitive Status Within Functional Limits for tasks assessed      Observation/Other Assessments   Focus on Therapeutic Outcomes (FOTO)  37%      Deep Tendon Reflexes   DTR Assessment Site Patella    Patella DTR 1+      ROM / Strength   AROM / PROM / Strength AROM;Strength      AROM   AROM Assessment Site Lumbar     Lumbar Flexion 50% Limited; stiffness; Gower sign on way up    Lumbar Extension 25% limited; no pain    Lumbar - Right Side Bend WFL; stiff    Lumbar - Left Side Bend WFL; stiff    Lumbar - Right Rotation 50% limited    Lumbar - Left Rotation 50% limited      Strength   Strength Assessment Site Hip;Knee;Ankle    Right/Left Hip Right;Left    Right Hip Flexion 3/5    Right Hip Extension 3-/5    Right Hip ABduction 4-/5    Left Hip Flexion 4-/5    Left Hip Extension 3-/5    Left Hip ABduction 4-/5    Right/Left Knee Right;Left    Right Knee Flexion 4-/5    Right Knee Extension 4+/5    Left Knee Flexion 4+/5    Left Knee Extension 4+/5    Right/Left Ankle Right;Left    Right Ankle Dorsiflexion 5/5    Left Ankle Dorsiflexion 5/5      Flexibility   Soft Tissue Assessment /Muscle Length yes    Hamstrings WFL      Palpation   Spinal mobility Generally hypomobile from thoracic through lumbar    Palpation comment Pain with palpation of lumbar spine and to sacrum      Special Tests    Special Tests Lumbar    Lumbar Tests Straight Leg Raise      Straight Leg Raise   Findings Negative    Comment Bilaterally      Balance   Balance Assessed Yes      Static Standing Balance   Static Standing - Balance Support No upper extremity supported    Static Standing - Comment/# of Minutes 2 seconds each LE                      Objective measurements completed on examination: See above findings.               PT Education - 06/22/20 1342    Education Details Discussed examination findings, POC, and initial HEP.    Person(s) Educated Patient    Methods Explanation;Handout    Comprehension Verbalized understanding  PT Short Term Goals - 06/22/20 1347      PT SHORT TERM GOAL #1   Title Patient will report understanding and regular compliance with HEP to improve patient's mobility and decrease pain.    Time 2    Period Weeks    Status New     Target Date 07/06/20             PT Long Term Goals - 06/22/20 1348      PT LONG TERM GOAL #1   Title Patient will demonstrate improvement in hip strength of at least 1/2 MMT strength grade in all deficient muscle groups to imrove mechanics and decrease force through patient's back.    Time 4    Period Weeks    Status New    Target Date 07/20/20      PT LONG TERM GOAL #2   Title Patient will report an improvement in overall subjective complaint of at least 50% for improved QOL.    Time 4    Period Weeks    Status New    Target Date 07/20/20      PT LONG TERM GOAL #3   Title Patient will demonstrate SLS time to at least 5 seconds on each LE for improved safety and balance.    Time 4    Period Weeks    Status New    Target Date 07/20/20                  Plan - 06/22/20 1402    Clinical Impression Statement Patient is a 74 year old female who presents to outpatient physical therapy with primary complaint of stiffness in her hips and back and a feeling of lower extremity weakness. Upon examination, noted decreased lumbar mobility and increased pain with this. Also noted bilateral hip weakness more notable on the right lower extremity. Patient also demonstrated decreased stability with single limb stance. Also noted increased tenderness to palpation particularly through the SIJ and lumbar spine. Patient would benefit from continued skilled physical therapy in order to address the abovementioned deficits and help patient reach her PLOF.    Personal Factors and Comorbidities Age;Comorbidity 3+;Time since onset of injury/illness/exacerbation    Comorbidities HTN, DM, history of neck surgery    Examination-Activity Limitations Stand;Locomotion Level;Bend;Sit    Examination-Participation Restrictions Community Activity;Meal Prep;Laundry    Stability/Clinical Decision Making Evolving/Moderate complexity    Clinical Decision Making Moderate    Rehab Potential Fair    PT Frequency  1x / week   Recommended frequency was 2x/week for 6 weeks, however patient requested to reduce to 1x/week for 4 weeks for financial reasons   PT Duration 4 weeks    PT Treatment/Interventions ADLs/Self Care Home Management;Aquatic Therapy;Cryotherapy;Electrical Stimulation;Moist Heat;Traction;DME Instruction;Gait training;Stair training;Functional mobility training;Therapeutic activities;Therapeutic exercise;Balance training;Neuromuscular re-education;Patient/family education;Orthotic Fit/Training;Manual techniques;Passive range of motion;Dry needling;Energy conservation;Taping    PT Next Visit Plan Review eval/goals and HEP. Trial hip isometrics for SIJ. LE functional strengthening and lumbar stretching.    PT Home Exercise Plan 06/22/20: LTR    Consulted and Agree with Plan of Care Patient           Patient will benefit from skilled therapeutic intervention in order to improve the following deficits and impairments:  Improper body mechanics, Pain, Decreased mobility, Postural dysfunction, Decreased activity tolerance, Decreased endurance, Decreased range of motion, Decreased strength, Hypomobility, Decreased balance  Visit Diagnosis: Low back pain, unspecified back pain laterality, unspecified chronicity, unspecified whether sciatica present  Muscle weakness (generalized)  Other symptoms and signs involving the musculoskeletal system     Problem List There are no problems to display for this patient.  Clarene Critchley PT, DPT 2:16 PM, 06/22/20 [place], [place], [address] Phone: [phone]   Fax:  [phone]  Name: TERRINA DOCTER MRN: [id number] Date of Birth: 12-Nov-1946

## 2020-06-29 ENCOUNTER — Ambulatory Visit (HOSPITAL_COMMUNITY): Payer: Medicare HMO | Admitting: Physical Therapy

## 2020-06-29 ENCOUNTER — Other Ambulatory Visit: Payer: Self-pay

## 2020-06-29 ENCOUNTER — Telehealth (HOSPITAL_COMMUNITY): Payer: Self-pay

## 2020-06-29 DIAGNOSIS — R29898 Other symptoms and signs involving the musculoskeletal system: Secondary | ICD-10-CM | POA: Diagnosis not present

## 2020-06-29 DIAGNOSIS — M545 Low back pain, unspecified: Secondary | ICD-10-CM

## 2020-06-29 DIAGNOSIS — M6281 Muscle weakness (generalized): Secondary | ICD-10-CM | POA: Diagnosis not present

## 2020-06-29 NOTE — Telephone Encounter (Signed)
Pt has other apptments and will not be here - did not want to r/s at this time

## 2020-06-29 NOTE — Therapy (Signed)
[place], [place], [address] Phone: [phone]   Fax:  [phone]  Physical Therapy Treatment  Patient Details  Name: Valerie Thomas MRN: [id number] Date of Birth: 02-02-1946 Referring Provider (PT): Elyn Peers, MD   Encounter Date: 06/29/2020   PT End of Session - 06/29/20 1611    Visit Number 2    Number of Visits 5    Date for PT Re-Evaluation 07/20/20    Authorization Type [employer]    Authorization Time Period Visits requested Check approval    Authorization - Visit Number 1    Authorization - Number of Visits 4    Progress Note Due on Visit 5    PT Start Time 3762    PT Stop Time 1610    PT Time Calculation (min) 40 min    Activity Tolerance Patient tolerated treatment well    Behavior During Therapy [facility] for tasks assessed/performed           Past Medical History:  Diagnosis Date  . Acid reflux   . Asthma   . Diabetes mellitus without complication ([place])   . Eczema   . Hypercholesteremia   . Hypertension   . Thyroid disease     Past Surgical History:  Procedure Laterality Date  . ABDOMINAL HYSTERECTOMY    . HAND SURGERY    . THYROID SURGERY    . torn ligament     neck surgery   . UTERINE FIBROID SURGERY      There were no vitals filed for this visit.   Subjective Assessment - 06/29/20 1539    Subjective PT states that she did the exercise given to her one time.  Right this minute she has discomfort that goes to her knee level.    Pertinent History Insidious onset of LBP    Limitations House hold activities;Standing;Walking    How long can you sit comfortably? Not limited    How long can you stand comfortably? 5 minutes    How long can you walk comfortably? 5 minutes notices a difference in her legs    Diagnostic tests X-rays: Lumbar, SIJ, HIP    Patient Stated Goals Strengthening particularly her legs to help her get her housework done    Currently in Pain? No/denies    get occasional pain going down her right leg to the knee level   Pain Radiating Towards Rt knee    Pain Onset More than a month ago    Pain Frequency Intermittent    Aggravating Factors  not sure    Pain Relieving Factors not sure    Effect of Pain on Daily Activities keeps going                             [facility] Adult PT Treatment/Exercise - 06/29/20 0001      Exercises   Exercises Lumbar      Lumbar Exercises: Standing   Heel Raises 10 reps    Other Standing Lumbar Exercises hip 3 D excursion     Other Standing Lumbar Exercises single leg stance x 3 B       Lumbar Exercises: Supine   Ab Set 10 reps    Bridge 10 reps    Other Supine Lumbar Exercises hip ab/adduction isometric       Manual Therapy   Manual Therapy Muscle Energy Technique    Manual therapy comments completed seperate  from all other aspects of treatment    Muscle Energy Technique for SI correction                   PT Education - 06/29/20 1555    Education Details hep    Person(s) Educated Patient    Methods Explanation    Comprehension Verbalized understanding            PT Short Term Goals - 06/29/20 1537      PT SHORT TERM GOAL #1   Title Patient will report understanding and regular compliance with HEP to improve patient's mobility and decrease pain.    Time 2    Period Weeks    Status On-going    Target Date 07/06/20             PT Long Term Goals - 06/29/20 1538      PT LONG TERM GOAL #1   Title Patient will demonstrate improvement in hip strength of at least 1/2 MMT strength grade in all deficient muscle groups to imrove mechanics and decrease force through patient's back.    Time 4    Period Weeks    Status On-going      PT LONG TERM GOAL #2   Title Patient will report an improvement in overall subjective complaint of at least 50% for improved QOL.    Time 4    Period Weeks    Status On-going      PT LONG TERM GOAL #3   Title Patient will  demonstrate SLS time to at least 5 seconds on each LE for improved safety and balance.    Time 4    Period Weeks    Status On-going                 Plan - 06/29/20 1612    Clinical Impression Statement Evaluation and goals reviewed with pt.  SI checked RT hip was anteriorly rotated which responded well to mm energy.  PT is uncomfortable on her back therefore therapist gave hip abduciton/abdominal set in sitting position.  Pt is eager to have more exercises to complete at home will add every wee since pt will only be seen weekly.    Personal Factors and Comorbidities Age;Comorbidity 3+;Time since onset of injury/illness/exacerbation    Comorbidities HTN, DM, history of neck surgery    Examination-Activity Limitations Stand;Locomotion Level;Bend;Sit    Examination-Participation Restrictions Community Activity;Meal Prep;Laundry    Stability/Clinical Decision Making Evolving/Moderate complexity    Rehab Potential Fair    PT Frequency 1x / week   Recommended frequency was 2x/week for 6 weeks, however patient requested to reduce to 1x/week for 4 weeks for financial reasons   PT Duration 4 weeks    PT Treatment/Interventions ADLs/Self Care Home Management;Aquatic Therapy;Cryotherapy;Electrical Stimulation;Moist Heat;Traction;DME Instruction;Gait training;Stair training;Functional mobility training;Therapeutic activities;Therapeutic exercise;Balance training;Neuromuscular re-education;Patient/family education;Orthotic Fit/Training;Manual techniques;Passive range of motion;Dry needling;Energy conservation;Taping    PT Next Visit Plan Add heel raises, sit to stand, single leg stance , knee to chest stretch for HEP    PT Home Exercise Plan 06/22/20: LTR; 7/29 : hiip excursion exercises, sitting transverse abdominal set, hip adduction isometric, supine bridge.    Consulted and Agree with Plan of Care Patient           Patient will benefit from skilled therapeutic intervention in order to improve  the following deficits and impairments:  Improper body mechanics, Pain, Decreased mobility, Postural dysfunction, Decreased activity tolerance, Decreased endurance, Decreased range of motion, Decreased  strength, Hypomobility, Decreased balance  Visit Diagnosis: Muscle weakness (generalized)  Low back pain, unspecified back pain laterality, unspecified chronicity, unspecified whether sciatica present  Other symptoms and signs involving the musculoskeletal system     Problem List There are no problems to display for this patient.  Rayetta Humphrey, PT CLT [phone] 06/29/2020, 4:17 PM  [place] [address] [place], [place], [address] Phone: [phone]   Fax:  [phone]  Name: MAYMUNAH STEGEMANN MRN: [id number] Date of Birth: May 26, 1946

## 2020-06-30 ENCOUNTER — Encounter (HOSPITAL_COMMUNITY): Payer: Medicare HMO

## 2020-06-30 DIAGNOSIS — E039 Hypothyroidism, unspecified: Secondary | ICD-10-CM | POA: Diagnosis not present

## 2020-06-30 DIAGNOSIS — M130 Polyarthritis, unspecified: Secondary | ICD-10-CM | POA: Diagnosis not present

## 2020-06-30 DIAGNOSIS — I10 Essential (primary) hypertension: Secondary | ICD-10-CM | POA: Diagnosis not present

## 2020-06-30 DIAGNOSIS — E1169 Type 2 diabetes mellitus with other specified complication: Secondary | ICD-10-CM | POA: Diagnosis not present

## 2020-07-05 ENCOUNTER — Encounter (HOSPITAL_COMMUNITY): Payer: Self-pay | Admitting: Physical Therapy

## 2020-07-05 ENCOUNTER — Ambulatory Visit (HOSPITAL_COMMUNITY): Payer: Medicare HMO | Attending: Family Medicine | Admitting: Physical Therapy

## 2020-07-05 ENCOUNTER — Other Ambulatory Visit: Payer: Self-pay

## 2020-07-05 DIAGNOSIS — R29898 Other symptoms and signs involving the musculoskeletal system: Secondary | ICD-10-CM | POA: Diagnosis not present

## 2020-07-05 DIAGNOSIS — M6281 Muscle weakness (generalized): Secondary | ICD-10-CM | POA: Insufficient documentation

## 2020-07-05 DIAGNOSIS — M545 Low back pain, unspecified: Secondary | ICD-10-CM

## 2020-07-05 NOTE — Therapy (Signed)
[place], [place], [address] Phone: [phone]   Fax:  [phone]  Physical Therapy Treatment  Patient Details  Name: Valerie Thomas MRN: [id number] Date of Birth: 12-14-1945 Referring Provider (PT): Elyn Peers, MD   Encounter Date: 07/05/2020   PT End of Session - 07/05/20 1001    Visit Number 3    Number of Visits 5    Date for PT Re-Evaluation 07/20/20    Authorization Type [employer]    Authorization Time Period Visits requested Check approval    Authorization - Visit Number 2    Authorization - Number of Visits 4    Progress Note Due on Visit 5    PT Start Time 0950    PT Stop Time 1028    PT Time Calculation (min) 38 min    Activity Tolerance Patient tolerated treatment well    Behavior During Therapy [facility] for tasks assessed/performed           Past Medical History:  Diagnosis Date  . Acid reflux   . Asthma   . Diabetes mellitus without complication ([place])   . Eczema   . Hypercholesteremia   . Hypertension   . Thyroid disease     Past Surgical History:  Procedure Laterality Date  . ABDOMINAL HYSTERECTOMY    . HAND SURGERY    . THYROID SURGERY    . torn ligament     neck surgery   . UTERINE FIBROID SURGERY      There were no vitals filed for this visit.   Subjective Assessment - 07/05/20 0954    Subjective Patient reported that she does have stiffness in her right knee, but no pain.    Pertinent History Insidious onset of LBP    Limitations House hold activities;Standing;Walking    How long can you sit comfortably? Not limited    How long can you stand comfortably? 5 minutes    How long can you walk comfortably? 5 minutes notices a difference in her legs    Diagnostic tests X-rays: Lumbar, SIJ, HIP    Patient Stated Goals Strengthening particularly her legs to help her get her housework done    Currently in Pain? No/denies                              [facility] Adult PT Treatment/Exercise - 07/05/20 0001      Lumbar Exercises: Standing   Heel Raises 10 reps    Other Standing Lumbar Exercises hip 3 D excursion x10 each    Other Standing Lumbar Exercises single leg stance x 5 B 5'' max      Lumbar Exercises: Seated   Sit to Stand 5 reps    Sit to Stand Limitations 3 sets; RTB around lower thighs to prevent knee valgus    Other Seated Lumbar Exercises Marching with abdominal set 20x alternating LEs seated on foam. Cues to reduce trunk compensation      Lumbar Exercises: Supine   Ab Set 10 reps                    PT Short Term Goals - 06/29/20 1537      PT SHORT TERM GOAL #1   Title Patient will report understanding and regular compliance with HEP to improve patient's mobility and decrease pain.    Time 2    Period Weeks  Status On-going    Target Date 07/06/20             PT Long Term Goals - 06/29/20 1538      PT LONG TERM GOAL #1   Title Patient will demonstrate improvement in hip strength of at least 1/2 MMT strength grade in all deficient muscle groups to imrove mechanics and decrease force through patient's back.    Time 4    Period Weeks    Status On-going      PT LONG TERM GOAL #2   Title Patient will report an improvement in overall subjective complaint of at least 50% for improved QOL.    Time 4    Period Weeks    Status On-going      PT LONG TERM GOAL #3   Title Patient will demonstrate SLS time to at least 5 seconds on each LE for improved safety and balance.    Time 4    Period Weeks    Status On-going                 Plan - 07/05/20 1006    Clinical Impression Statement Focused on lower extremity and core strengthening this session. With sit to stands noted bilateral knee valgus so used a RTB around patient's lower thighs to decrease knee valgus with noted improvements. With seated marching with abdominal set patient required cueing to decrease trunk compensation. Educated patient on  DOMS and updated HEP.    Personal Factors and Comorbidities Age;Comorbidity 3+;Time since onset of injury/illness/exacerbation    Comorbidities HTN, DM, history of neck surgery    Examination-Activity Limitations Stand;Locomotion Level;Bend;Sit    Examination-Participation Restrictions Community Activity;Meal Prep;Laundry    Stability/Clinical Decision Making Evolving/Moderate complexity    Rehab Potential Fair    PT Frequency 1x / week   Recommended frequency was 2x/week for 6 weeks, however patient requested to reduce to 1x/week for 4 weeks for financial reasons   PT Duration 4 weeks    PT Treatment/Interventions ADLs/Self Care Home Management;Aquatic Therapy;Cryotherapy;Electrical Stimulation;Moist Heat;Traction;DME Instruction;Gait training;Stair training;Functional mobility training;Therapeutic activities;Therapeutic exercise;Balance training;Neuromuscular re-education;Patient/family education;Orthotic Fit/Training;Manual techniques;Passive range of motion;Dry needling;Energy conservation;Taping    PT Next Visit Plan Follow-up regarding knee stiffness consider gentle bicycle warm-up for improved mobility next session. Add SLS and heel raise next session. Continue STS with RTB to reduce valgus.    PT Home Exercise Plan 06/22/20: LTR; 7/29 : hiip excursion exercises, sitting transverse abdominal set, hip adduction isometric, supine bridge. 07/05/20: [facility] - [id number]    Consulted and Agree with Plan of Care Patient           Patient will benefit from skilled therapeutic intervention in order to improve the following deficits and impairments:  Improper body mechanics, Pain, Decreased mobility, Postural dysfunction, Decreased activity tolerance, Decreased endurance, Decreased range of motion, Decreased strength, Hypomobility, Decreased balance  Visit Diagnosis: Low back pain, unspecified back pain laterality, unspecified chronicity, unspecified whether sciatica present  Muscle weakness  (generalized)  Other symptoms and signs involving the musculoskeletal system     Problem List There are no problems to display for this patient.  Clarene Critchley PT, DPT 10:44 AM, 07/05/20 [place], [place], [address] Phone: [phone]   Fax:  [phone]  Name: Valerie Thomas MRN: [id number] Date of Birth: 1946-05-16

## 2020-07-14 ENCOUNTER — Encounter (HOSPITAL_COMMUNITY): Payer: Self-pay

## 2020-07-14 ENCOUNTER — Other Ambulatory Visit: Payer: Self-pay

## 2020-07-14 ENCOUNTER — Ambulatory Visit (HOSPITAL_COMMUNITY): Payer: Medicare HMO

## 2020-07-14 DIAGNOSIS — R29898 Other symptoms and signs involving the musculoskeletal system: Secondary | ICD-10-CM

## 2020-07-14 DIAGNOSIS — M545 Low back pain, unspecified: Secondary | ICD-10-CM

## 2020-07-14 DIAGNOSIS — M6281 Muscle weakness (generalized): Secondary | ICD-10-CM | POA: Diagnosis not present

## 2020-07-14 NOTE — Therapy (Signed)
[place], [place], [address] Phone: [phone]   Fax:  [phone]  Physical Therapy Treatment  Patient Details  Name: Valerie Thomas MRN: [id number] Date of Birth: 04-15-1946 Referring Provider (PT): Elyn Peers, MD   Encounter Date: 07/14/2020   PT End of Session - 07/14/20 1132    Visit Number 4    Number of Visits 5    Date for PT Re-Evaluation 07/20/20    Authorization Type [employer]    Authorization Time Period 5 visits approved from 7/22-->07/20/20    Authorization - Visit Number 4    Authorization - Number of Visits 5    Progress Note Due on Visit 5    PT Start Time 1127   4' on bike, not included wiht charges   PT Stop Time 1210    PT Time Calculation (min) 43 min    Activity Tolerance Patient tolerated treatment well    Behavior During Therapy [facility] for tasks assessed/performed           Past Medical History:  Diagnosis Date  . Acid reflux   . Asthma   . Diabetes mellitus without complication ([place])   . Eczema   . Hypercholesteremia   . Hypertension   . Thyroid disease     Past Surgical History:  Procedure Laterality Date  . ABDOMINAL HYSTERECTOMY    . HAND SURGERY    . THYROID SURGERY    . torn ligament     neck surgery   . UTERINE FIBROID SURGERY      There were no vitals filed for this visit.   Subjective Assessment - 07/14/20 1131    Subjective Pt reports she has some stiffness in her seat as well as knee, no reports of pain today.    Patient Stated Goals Strengthening particularly her legs to help her get her housework done    Currently in Pain? No/denies    Pain Onset More than a month ago    Pain Frequency Intermittent                             [facility] Adult PT Treatment/Exercise - 07/14/20 0001      Lumbar Exercises: Aerobic   Stationary Bike seat 7x 4' for mobility      Lumbar Exercises: Standing   Functional Squats 10 reps    Functional  Squats Limitations front of chair for mechanics    Other Standing Lumbar Exercises hip 3 D excursion x10 each    Other Standing Lumbar Exercises Alternating marching 10x 3" intermittent HHA; hip abd/ext 10 (cueing for posture and to reduce ER); SLS max of 5 7-8"; tandem stance 2x 30" with intermittent HHA      Lumbar Exercises: Seated   Sit to Stand 10 reps    Sit to Stand Limitations 2 sets; RTB around lower thighs to prevent knee valgus                    PT Short Term Goals - 06/29/20 1537      PT SHORT TERM GOAL #1   Title Patient will report understanding and regular compliance with HEP to improve patient's mobility and decrease pain.    Time 2    Period Weeks    Status On-going    Target Date 07/06/20             PT Long Term  Goals - 06/29/20 1538      PT LONG TERM GOAL #1   Title Patient will demonstrate improvement in hip strength of at least 1/2 MMT strength grade in all deficient muscle groups to imrove mechanics and decrease force through patient's back.    Time 4    Period Weeks    Status On-going      PT LONG TERM GOAL #2   Title Patient will report an improvement in overall subjective complaint of at least 50% for improved QOL.    Time 4    Period Weeks    Status On-going      PT LONG TERM GOAL #3   Title Patient will demonstrate SLS time to at least 5 seconds on each LE for improved safety and balance.    Time 4    Period Weeks    Status On-going                 Plan - 07/14/20 1215    Clinical Impression Statement Added bike and continued 3D excursion exercises to address mobility.  Reviewed current HEP with additional STS, SLS and heel raises to address weakness with printout given to pt.  Added balance and standing hip strengthening exercises to POC with some cueing required for proper mechanics.  Pt fatigues quickly required seated rest breaks between activities.  EOS reports of stiffness resolved.    Personal Factors and  Comorbidities Age;Comorbidity 3+;Time since onset of injury/illness/exacerbation    Comorbidities HTN, DM, history of neck surgery    Examination-Activity Limitations Stand;Locomotion Level;Bend;Sit    Examination-Participation Restrictions Community Activity;Meal Prep;Laundry    Clinical Decision Making Moderate    Rehab Potential Fair    PT Frequency 1x / week   Recommended frequency was 2x/week for 6 weeks, however patient requested to reduce to 1x/week for 4 weeks for financial reasons   PT Duration 4 weeks    PT Treatment/Interventions ADLs/Self Care Home Management;Aquatic Therapy;Cryotherapy;Electrical Stimulation;Moist Heat;Traction;DME Instruction;Gait training;Stair training;Functional mobility training;Therapeutic activities;Therapeutic exercise;Balance training;Neuromuscular re-education;Patient/family education;Orthotic Fit/Training;Manual techniques;Passive range of motion;Dry needling;Energy conservation;Taping    PT Next Visit Plan Review goals and assured compliance iwth HEP.    PT Home Exercise Plan 06/22/20: LTR; 7/29 : hiip excursion exercises, sitting transverse abdominal set, hip adduction isometric, supine bridge. 07/05/20: [facility] - [id number]; 07/14/20:  heel raises, STS with RTB and SLS by counter/sink for safety.           Patient will benefit from skilled therapeutic intervention in order to improve the following deficits and impairments:  Improper body mechanics, Pain, Decreased mobility, Postural dysfunction, Decreased activity tolerance, Decreased endurance, Decreased range of motion, Decreased strength, Hypomobility, Decreased balance  Visit Diagnosis: Other symptoms and signs involving the musculoskeletal system  Muscle weakness (generalized)  Low back pain, unspecified back pain laterality, unspecified chronicity, unspecified whether sciatica present     Problem List There are no problems to display for this patient.  Ihor Austin, LPTA/CLT;  CBIS [phone]  Aldona Lento 07/14/2020, 12:22 PM  [place] [address] [place], [place], [address] Phone: [phone]   Fax:  [phone]  Name: ROSSANNA SPITZLEY MRN: [id number] Date of Birth: [birth year]

## 2020-07-14 NOTE — Patient Instructions (Addendum)
Functional Quadriceps: Sit to Stand    Theraband around thighs, keep knees separated as you stand. Sit on edge of chair, feet flat on floor. Stand upright, extending knees fully. Repeat 10 times per set. Do 2 sets per session.   http://orth.exer.us/735   Copyright  VHI. All rights reserved.   Heel Raises    Stand with support by sink/counter for safety. Tighten pelvic floor and hold. With knees straight, raise heels off ground.  Hold 5 seconds. Repeat 10 times. Do 2 times a day.  Copyright  VHI. All rights reserved.   Single Leg Balance: Eyes Open    Stand by sink/counter for safety.  Stand on each leg with eyes open. Hold 30 seconds. 5 reps 2 times per day.  http://ggbe.exer.us/5   Copyright  VHI. All rights reserved.

## 2020-07-19 ENCOUNTER — Encounter (HOSPITAL_COMMUNITY): Payer: Medicare HMO | Admitting: Physical Therapy

## 2020-07-25 ENCOUNTER — Ambulatory Visit (HOSPITAL_COMMUNITY): Payer: Medicare HMO | Admitting: Physical Therapy

## 2020-07-25 ENCOUNTER — Encounter (HOSPITAL_COMMUNITY): Payer: Self-pay | Admitting: Physical Therapy

## 2020-07-25 ENCOUNTER — Other Ambulatory Visit: Payer: Self-pay

## 2020-07-25 DIAGNOSIS — R29898 Other symptoms and signs involving the musculoskeletal system: Secondary | ICD-10-CM | POA: Diagnosis not present

## 2020-07-25 DIAGNOSIS — M545 Low back pain, unspecified: Secondary | ICD-10-CM

## 2020-07-25 DIAGNOSIS — M6281 Muscle weakness (generalized): Secondary | ICD-10-CM | POA: Diagnosis not present

## 2020-07-25 NOTE — Patient Instructions (Addendum)
Bridge    Lie back, legs bent. Inhale, pressing hips up. Keeping ribs in, lengthen lower back. Exhale, rolling down along spine from top. Repeat 10 times. Do 1-2 sessions per day.  http://pm.exer.us/55   Copyright  VHI. All rights reserved.   

## 2020-07-25 NOTE — Therapy (Signed)
[place], [place], [address] Phone: [phone]   Fax:  [phone]  Physical Therapy Treatment / Progress Note / Discharge Summary  Patient Details  Name: Valerie Thomas MRN: [id number] Date of Birth: May 29, 1946 Referring Provider (PT): Elyn Peers, MD   Encounter Date: 07/25/2020   Progress Note Reporting Period 06/22/20 to 07/25/20  See note below for Objective Data and Assessment of Progress/Goals.       PHYSICAL THERAPY DISCHARGE SUMMARY  Visits from Start of Care: 5  Current functional level related to goals / functional outcomes: See below   Remaining deficits: See below   Education / Equipment: Updated HEP Plan: Patient agrees to discharge.  Patient goals were partially met. Patient is being discharged due to financial reasons.  ?????         PT End of Session - 07/25/20 1116    Visit Number 5    Number of Visits 5    Date for PT Re-Evaluation 07/25/20    Authorization Type [employer]    Authorization Time Period 5 visits approved from 7/22-->07/20/20; 1 requested 07/25/20    Authorization - Visit Number 5    Authorization - Number of Visits 5    Progress Note Due on Visit 5    PT Start Time 6962   Patient arrived late   PT Stop Time 1108    PT Time Calculation (min) 28 min    Activity Tolerance Patient tolerated treatment well    Behavior During Therapy WFL for tasks assessed/performed           Past Medical History:  Diagnosis Date  . Acid reflux   . Asthma   . Diabetes mellitus without complication ([place])   . Eczema   . Hypercholesteremia   . Hypertension   . Thyroid disease     Past Surgical History:  Procedure Laterality Date  . ABDOMINAL HYSTERECTOMY    . HAND SURGERY    . THYROID SURGERY    . torn ligament     neck surgery   . UTERINE FIBROID SURGERY      There were no vitals filed for this visit.   Subjective Assessment - 07/25/20 1043    Subjective  Patient reported that she still has some pain in her right leg at times, but denied any pain currently. Stated she would like to get a list of exercises to continue doing at home and be discharged from therapy. She reported improving 25% since beginning therapy.    Patient Stated Goals Strengthening particularly her legs to help her get her housework done    Currently in Pain? No/denies    Pain Onset More than a month ago              [facility] PT Assessment - 07/25/20 0001      Assessment   Medical Diagnosis LBP    Referring Provider (PT) Elyn Peers, MD      Precautions   Precautions None      Restrictions   Weight Bearing Restrictions No      Balance Screen   Has the patient fallen in the past 6 months No      [place] residence      Cognition   Overall Cognitive Status Within Functional Limits for tasks assessed      Observation/Other Assessments   Focus on Therapeutic Outcomes (FOTO)  55.5%  AROM   Lumbar Flexion 25% limited no gower sign   was 50% with Gower sign   Lumbar Extension WFL   was 25% limited   Lumbar - Right Side Bend WFL    Lumbar - Left Side Bend WFL     Lumbar - Right Rotation 25% limited    Lumbar - Left Rotation 25% limited      Strength   Right/Left Hip Right;Left    Right Hip Flexion 3+/5   was 3   Right Hip Extension 3-/5   was 3-   Right Hip ABduction 4/5   was 4-   Left Hip Flexion 4/5   was 4-   Left Hip Extension 3-/5   was 3-   Left Hip ABduction 4+/5   was 4-   Right Knee Flexion 5/5   was 4-   Right Knee Extension 5/5   was 4+   Left Knee Flexion 5/5   was 4+   Left Knee Extension 5/5   was 4+   Right Ankle Dorsiflexion 5/5    Left Ankle Dorsiflexion 5/5      Static Standing Balance   Static Standing - Balance Support No upper extremity supported    Static Standing - Comment/# of Minutes 10 seconds Each LE                         [facility] Adult PT Treatment/Exercise -  07/25/20 0001      Lumbar Exercises: Stretches   Lower Trunk Rotation 10 seconds;5 reps                  PT Education - 07/25/20 1115    Education Details Educated on re-assessment findings. Discussed continuing HEP and provided handouts of exercises patient was unsure of.    Person(s) Educated Patient    Methods Explanation;Handout    Comprehension Verbalized understanding            PT Short Term Goals - 07/25/20 1045      PT SHORT TERM GOAL #1   Title Patient will report understanding and regular compliance with HEP to improve patient's mobility and decrease pain.    Time 2    Period Weeks    Status Achieved    Target Date 07/06/20             PT Long Term Goals - 07/25/20 1046      PT LONG TERM GOAL #1   Title Patient will demonstrate improvement in hip strength of at least 1/2 MMT strength grade in all deficient muscle groups to imrove mechanics and decrease force through patient's back.    Baseline 07/25/20: See MMT, still limited in hip extension    Time 4    Period Weeks    Status Partially Met      PT LONG TERM GOAL #2   Title Patient will report an improvement in overall subjective complaint of at least 50% for improved QOL.    Baseline 07/25/20: 25% improvement since beginning therapy    Time 4    Period Weeks    Status Partially Met      PT LONG TERM GOAL #3   Title Patient will demonstrate SLS time to at least 5 seconds on each LE for improved safety and balance.    Time 4    Period Weeks    Status Achieved  Plan - 07/25/20 1129    Clinical Impression Statement Performed a re-assessment of patient's progress towards goals this session. Patient achieved 1 out of 1 short term goals. Patient achieved 1 out 3 long term goals and partially met the remaining 2 goals. Educated patient on remaining areas of deficits most notably hip extension strengthening. Reviewed patient's HEP and provided cueing for proper performance of  exercises as well as provided handouts and emphasized bridges for hip extension strengthening. Patient would benefit from continued physical therapy, however, patient requested discharge from further therapy at this time due to financial reasons and plans to continue HEP. Educated that if patient wished to resume therapy, she will need a new referral from her MD and she gave consent to this.    Personal Factors and Comorbidities Age;Comorbidity 3+;Time since onset of injury/illness/exacerbation    Comorbidities HTN, DM, history of neck surgery    Examination-Activity Limitations Stand;Locomotion Level;Bend;Sit    Examination-Participation Restrictions Community Activity;Meal Prep;Laundry    Rehab Potential Fair    PT Frequency 1x / week   Recommended frequency was 2x/week for 6 weeks, however patient requested to reduce to 1x/week for 4 weeks for financial reasons   PT Duration 4 weeks    PT Treatment/Interventions ADLs/Self Care Home Management;Aquatic Therapy;Cryotherapy;Electrical Stimulation;Moist Heat;Traction;DME Instruction;Gait training;Stair training;Functional mobility training;Therapeutic activities;Therapeutic exercise;Balance training;Neuromuscular re-education;Patient/family education;Orthotic Fit/Training;Manual techniques;Passive range of motion;Dry needling;Energy conservation;Taping    PT Next Visit Plan Discharged    PT Home Exercise Plan 06/22/20: LTR; 7/29 : hiip excursion exercises, sitting transverse abdominal set, hip adduction isometric, supine bridge. 07/05/20: [facility] - [id number]; 07/14/20:  heel raises, STS with RTB and SLS by counter/sink for safety.           Patient will benefit from skilled therapeutic intervention in order to improve the following deficits and impairments:  Improper body mechanics, Pain, Decreased mobility, Postural dysfunction, Decreased activity tolerance, Decreased endurance, Decreased range of motion, Decreased strength, Hypomobility, Decreased  balance  Visit Diagnosis: Low back pain, unspecified back pain laterality, unspecified chronicity, unspecified whether sciatica present  Other symptoms and signs involving the musculoskeletal system  Muscle weakness (generalized)     Problem List There are no problems to display for this patient.  Clarene Critchley PT, DPT 11:31 AM, 07/25/20 [place], [place], [address] Phone: [phone]   Fax:  [phone]  Name: SABENA WINNER MRN: [id number] Date of Birth: 08/03/1946

## 2020-08-17 DIAGNOSIS — E119 Type 2 diabetes mellitus without complications: Secondary | ICD-10-CM | POA: Diagnosis not present

## 2020-08-17 DIAGNOSIS — E785 Hyperlipidemia, unspecified: Secondary | ICD-10-CM | POA: Diagnosis not present

## 2020-08-17 DIAGNOSIS — E1169 Type 2 diabetes mellitus with other specified complication: Secondary | ICD-10-CM | POA: Diagnosis not present

## 2020-08-17 DIAGNOSIS — E039 Hypothyroidism, unspecified: Secondary | ICD-10-CM | POA: Diagnosis not present

## 2020-08-17 DIAGNOSIS — E669 Obesity, unspecified: Secondary | ICD-10-CM | POA: Diagnosis not present

## 2020-08-17 DIAGNOSIS — I10 Essential (primary) hypertension: Secondary | ICD-10-CM | POA: Diagnosis not present

## 2020-08-17 DIAGNOSIS — Z23 Encounter for immunization: Secondary | ICD-10-CM | POA: Diagnosis not present

## 2020-09-14 DIAGNOSIS — H353124 Nonexudative age-related macular degeneration, left eye, advanced atrophic with subfoveal involvement: Secondary | ICD-10-CM | POA: Diagnosis not present

## 2020-09-14 DIAGNOSIS — H524 Presbyopia: Secondary | ICD-10-CM | POA: Diagnosis not present

## 2020-09-14 DIAGNOSIS — Z0100 Encounter for examination of eyes and vision without abnormal findings: Secondary | ICD-10-CM | POA: Diagnosis not present

## 2020-09-30 DIAGNOSIS — E1169 Type 2 diabetes mellitus with other specified complication: Secondary | ICD-10-CM | POA: Diagnosis not present

## 2020-09-30 DIAGNOSIS — M130 Polyarthritis, unspecified: Secondary | ICD-10-CM | POA: Diagnosis not present

## 2020-09-30 DIAGNOSIS — E039 Hypothyroidism, unspecified: Secondary | ICD-10-CM | POA: Diagnosis not present

## 2020-09-30 DIAGNOSIS — I10 Essential (primary) hypertension: Secondary | ICD-10-CM | POA: Diagnosis not present

## 2020-11-16 DIAGNOSIS — E1169 Type 2 diabetes mellitus with other specified complication: Secondary | ICD-10-CM | POA: Diagnosis not present

## 2020-11-16 DIAGNOSIS — J069 Acute upper respiratory infection, unspecified: Secondary | ICD-10-CM | POA: Diagnosis not present

## 2020-11-16 DIAGNOSIS — I10 Essential (primary) hypertension: Secondary | ICD-10-CM | POA: Diagnosis not present

## 2020-11-16 DIAGNOSIS — E038 Other specified hypothyroidism: Secondary | ICD-10-CM | POA: Diagnosis not present

## 2020-12-30 DIAGNOSIS — E1169 Type 2 diabetes mellitus with other specified complication: Secondary | ICD-10-CM | POA: Diagnosis not present

## 2020-12-30 DIAGNOSIS — M130 Polyarthritis, unspecified: Secondary | ICD-10-CM | POA: Diagnosis not present

## 2020-12-30 DIAGNOSIS — E039 Hypothyroidism, unspecified: Secondary | ICD-10-CM | POA: Diagnosis not present

## 2020-12-30 DIAGNOSIS — I10 Essential (primary) hypertension: Secondary | ICD-10-CM | POA: Diagnosis not present

## 2021-02-08 DIAGNOSIS — E038 Other specified hypothyroidism: Secondary | ICD-10-CM | POA: Diagnosis not present

## 2021-02-08 DIAGNOSIS — E785 Hyperlipidemia, unspecified: Secondary | ICD-10-CM | POA: Diagnosis not present

## 2021-02-08 DIAGNOSIS — I10 Essential (primary) hypertension: Secondary | ICD-10-CM | POA: Diagnosis not present

## 2021-02-08 DIAGNOSIS — E119 Type 2 diabetes mellitus without complications: Secondary | ICD-10-CM | POA: Diagnosis not present

## 2021-02-13 ENCOUNTER — Other Ambulatory Visit (HOSPITAL_COMMUNITY): Payer: Self-pay | Admitting: Family Medicine

## 2021-02-13 DIAGNOSIS — Z1231 Encounter for screening mammogram for malignant neoplasm of breast: Secondary | ICD-10-CM

## 2021-02-15 DIAGNOSIS — E785 Hyperlipidemia, unspecified: Secondary | ICD-10-CM | POA: Diagnosis not present

## 2021-02-15 DIAGNOSIS — E1169 Type 2 diabetes mellitus with other specified complication: Secondary | ICD-10-CM | POA: Diagnosis not present

## 2021-02-15 DIAGNOSIS — E038 Other specified hypothyroidism: Secondary | ICD-10-CM | POA: Diagnosis not present

## 2021-02-28 DIAGNOSIS — Z0000 Encounter for general adult medical examination without abnormal findings: Secondary | ICD-10-CM | POA: Diagnosis not present

## 2021-03-01 DIAGNOSIS — I10 Essential (primary) hypertension: Secondary | ICD-10-CM | POA: Diagnosis not present

## 2021-03-01 DIAGNOSIS — E1169 Type 2 diabetes mellitus with other specified complication: Secondary | ICD-10-CM | POA: Diagnosis not present

## 2021-03-01 DIAGNOSIS — E039 Hypothyroidism, unspecified: Secondary | ICD-10-CM | POA: Diagnosis not present

## 2021-03-26 ENCOUNTER — Other Ambulatory Visit: Payer: Self-pay

## 2021-03-26 ENCOUNTER — Ambulatory Visit (HOSPITAL_COMMUNITY)
Admission: RE | Admit: 2021-03-26 | Discharge: 2021-03-26 | Disposition: A | Discharge status: Home / Self Care | Payer: Medicare HMO | Source: Ambulatory Visit | Attending: Family Medicine | Admitting: Family Medicine

## 2021-03-26 DIAGNOSIS — Z1231 Encounter for screening mammogram for malignant neoplasm of breast: Secondary | ICD-10-CM | POA: Diagnosis not present

## 2021-05-31 DIAGNOSIS — M130 Polyarthritis, unspecified: Secondary | ICD-10-CM | POA: Diagnosis not present

## 2021-05-31 DIAGNOSIS — E039 Hypothyroidism, unspecified: Secondary | ICD-10-CM | POA: Diagnosis not present

## 2021-05-31 DIAGNOSIS — I10 Essential (primary) hypertension: Secondary | ICD-10-CM | POA: Diagnosis not present

## 2021-05-31 DIAGNOSIS — E1169 Type 2 diabetes mellitus with other specified complication: Secondary | ICD-10-CM | POA: Diagnosis not present

## 2021-06-05 ENCOUNTER — Other Ambulatory Visit: Payer: Self-pay

## 2021-06-05 ENCOUNTER — Emergency Department (HOSPITAL_COMMUNITY)
Admission: EM | Admit: 2021-06-05 | Discharge: 2021-06-05 | Disposition: A | Discharge status: Home / Self Care | Payer: Medicare HMO | Attending: Emergency Medicine | Admitting: Emergency Medicine

## 2021-06-05 ENCOUNTER — Emergency Department (HOSPITAL_COMMUNITY): Payer: Medicare HMO

## 2021-06-05 ENCOUNTER — Encounter (HOSPITAL_COMMUNITY): Payer: Self-pay | Admitting: *Deleted

## 2021-06-05 DIAGNOSIS — I10 Essential (primary) hypertension: Secondary | ICD-10-CM | POA: Diagnosis not present

## 2021-06-05 DIAGNOSIS — J45909 Unspecified asthma, uncomplicated: Secondary | ICD-10-CM | POA: Insufficient documentation

## 2021-06-05 DIAGNOSIS — M25552 Pain in left hip: Secondary | ICD-10-CM | POA: Diagnosis not present

## 2021-06-05 DIAGNOSIS — M5459 Other low back pain: Secondary | ICD-10-CM | POA: Diagnosis not present

## 2021-06-05 DIAGNOSIS — R52 Pain, unspecified: Secondary | ICD-10-CM

## 2021-06-05 DIAGNOSIS — E119 Type 2 diabetes mellitus without complications: Secondary | ICD-10-CM | POA: Insufficient documentation

## 2021-06-05 DIAGNOSIS — M545 Low back pain, unspecified: Secondary | ICD-10-CM | POA: Diagnosis not present

## 2021-06-05 DIAGNOSIS — M5441 Lumbago with sciatica, right side: Secondary | ICD-10-CM | POA: Diagnosis not present

## 2021-06-05 DIAGNOSIS — Z7984 Long term (current) use of oral hypoglycemic drugs: Secondary | ICD-10-CM | POA: Diagnosis not present

## 2021-06-05 DIAGNOSIS — M25551 Pain in right hip: Secondary | ICD-10-CM | POA: Diagnosis not present

## 2021-06-05 DIAGNOSIS — Z79899 Other long term (current) drug therapy: Secondary | ICD-10-CM | POA: Insufficient documentation

## 2021-06-05 LAB — URINALYSIS, ROUTINE W REFLEX MICROSCOPIC
Bilirubin Urine: NEGATIVE
Glucose, UA: NEGATIVE mg/dL
Hgb urine dipstick: NEGATIVE
Ketones, ur: NEGATIVE mg/dL
Leukocytes,Ua: NEGATIVE
Nitrite: NEGATIVE
Protein, ur: NEGATIVE mg/dL
Specific Gravity, Urine: 1.006 (ref 1.005–1.030)
pH: 7 (ref 5.0–8.0)

## 2021-06-05 MED ORDER — METHOCARBAMOL 500 MG PO TABS: 500.0000 mg | ORAL_TABLET | Freq: Two times a day (BID) | ORAL | 0 refills | Status: DC | PRN

## 2021-06-05 MED ORDER — ACETAMINOPHEN 500 MG PO TABS: 500.0000 mg | ORAL_TABLET | Freq: Four times a day (QID) | ORAL | 0 refills | Status: AC | PRN

## 2021-06-05 MED ORDER — LIDOCAINE 5 % EX PTCH
1.0000 | MEDICATED_PATCH | CUTANEOUS | Status: DC
  Administered 2021-06-05: 1 via TRANSDERMAL
  Filled 2021-06-05: qty 1

## 2021-06-05 MED ORDER — LIDOCAINE 5 % EX PTCH: 1.0000 | MEDICATED_PATCH | CUTANEOUS | 0 refills | Status: DC

## 2021-06-05 MED ORDER — DICLOFENAC SODIUM 1 % EX GEL: 4.0000 g | Freq: Four times a day (QID) | CUTANEOUS | 0 refills | Status: DC

## 2021-06-05 MED ORDER — ACETAMINOPHEN 325 MG PO TABS
650.0000 mg | ORAL_TABLET | Freq: Once | ORAL | Status: AC
  Administered 2021-06-05: 650 mg via ORAL
  Filled 2021-06-05: qty 2

## 2021-06-05 NOTE — Discharge Instructions (Addendum)
Your work-up today was overall quite reassuring.  Please use Voltaren gel you may use this throughout the day this is a topical medication when she was put on the areas of pain.  Take Tylenol up to 1000 mg every 6 hours for pain.  I also prescribed you lidocaine patches to use throughout the day.  Please follow-up with your primary care provider as well as an orthopedic doctor.  I given you the information for an orthopedist that is here in [place].

## 2021-06-05 NOTE — ED Triage Notes (Signed)
States she was seen at urgent care in [place] and referred here for CT scan today. States she fell in the bathroom 2 weeks ago and the pain was really severe today

## 2021-06-05 NOTE — ED Provider Notes (Signed)
[facility] EMERGENCY DEPARTMENT Provider Note   CSN: [id number] Arrival date & time: 06/05/21  1439     History Chief Complaint  Patient presents with   Back Pain    Valerie Thomas is a 74 y.o. female.  HPI Patient is a 75 year old female with past medical history significant for DM2, HLD, HTN, thyroid disease  Patient is presented to the ER today with low back pain that she states is severe achy constant worse with movement she states that she had difficulty getting out of the bed today because of how painful it was.  She states that she has a history of arthritis and states that she takes gabapentin and Tylenol for her pain.  She states that she had a fall in the bathroom while she was showering June 25.  She states that it was a mechanical fall that occurred because she slipped while attempting to sit down on the side of the shower/bathtub.  She states that she fell backwards in the bathtub smacked her head against the ground and had some achy back pain.  She states that she was counseled by her PCP to take Tylenol for the pain and states that she has had some achy pain since then however she states that she woke up this morning felt significantly worse than she had in the past.  She denies any new traumas.  Denies any abdominal pain nausea vomiting diarrhea.  Denies any vaginal discharge urinary frequency urgency or dysuria.  States she is on no blood thinners no history of cancer denies any IV drug use.  Denies any new numbness or weakness in any extremities although she states that her right knee which suffers from arthritis feels that has been getting out today.     Past Medical History:  Diagnosis Date   Acid reflux    Asthma    Diabetes mellitus without complication ([place])    Eczema    Hypercholesteremia    Hypertension    Thyroid disease     There are no problems to display for this patient.   Past Surgical History:  Procedure Laterality Date   ABDOMINAL  HYSTERECTOMY     HAND SURGERY     THYROID SURGERY     torn ligament     neck surgery    UTERINE FIBROID SURGERY       OB History   No obstetric history on file.     Family History  Problem Relation Age of Onset   Asthma Sister    Allergic rhinitis Neg Hx    Urticaria Neg Hx    Eczema Neg Hx     Social History   Tobacco Use   Smoking status: Never   Smokeless tobacco: Never  Substance Use Topics   Alcohol use: No   Drug use: No    Home Medications Prior to Admission medications   Medication Sig Start Date End Date Taking? Authorizing Provider  acetaminophen (TYLENOL) 500 MG tablet Take 1-2 tablets (500-1,000 mg total) by mouth every 6 (six) hours as needed. 06/05/21  Yes Livier Hendel S, PA  albuterol (PROVENTIL HFA;VENTOLIN HFA) 108 (90 BASE) MCG/ACT inhaler Inhale 2 puffs into the lungs every 6 (six) hours as needed for wheezing.   Yes [provider]  amLODipine (NORVASC) 10 MG tablet Take 10 mg by mouth daily.   Yes [provider]  Cod Liver Oil CAPS Take 1 capsule by mouth daily.   Yes [provider]  diclofenac Sodium (VOLTAREN)  1 % GEL Apply 4 g topically 4 (four) times daily. 06/05/21  Yes Gee Habig S, PA  EPINEPHrine (EPIPEN 2-PAK) 0.3 mg/0.3 mL IJ SOAJ injection Inject 0.3 mLs (0.3 mg total) into the muscle once. 10/31/15  Yes Gean Quint, MD  gabapentin (NEURONTIN) 300 MG capsule Take 300 mg by mouth 2 (two) times daily. 07/04/19  Yes [provider]  JANUVIA 100 MG tablet Take 100 mg by mouth daily. 05/22/19  Yes [provider]  levothyroxine (SYNTHROID, LEVOTHROID) 100 MCG tablet Take 100 mcg by mouth daily before breakfast.   Yes [provider]  lidocaine (LIDODERM) 5 % Place 1 patch onto the skin daily. Remove & Discard patch within 12 hours or as directed by MD 06/05/21  Yes Milen Lengacher, Kathleene Hazel, PA  losartan-hydrochlorothiazide (HYZAAR) 100-25 MG per tablet Take 1 tablet by mouth daily.   Yes [provider]  metFORMIN (GLUCOPHAGE) 500 MG tablet Take 500 mg by mouth 2 (two) times daily with a meal.   Yes [provider]  methocarbamol (ROBAXIN) 500 MG tablet Take 1 tablet (500 mg total) by mouth 2 (two) times daily as needed for muscle spasms. 06/05/21  Yes Noemi Chapel, MD  montelukast (SINGULAIR) 10 MG tablet Take 10 mg by mouth daily.   Yes [provider]  Polyethyl Glycol-Propyl Glycol (SYSTANE OP) Apply 1 drop to eye daily as needed (dry eyes).   Yes [provider]  potassium chloride SA (K-DUR,KLOR-CON) 20 MEQ tablet Take 20 mEq by mouth 2 (two) times daily.   Yes [provider]  simvastatin (ZOCOR) 40 MG tablet Take 40 mg by mouth daily.   Yes [provider]  sodium chloride (OCEAN) 0.65 % nasal spray Place 1 spray into the nose as needed for congestion.   Yes [provider]  sulindac (CLINORIL) 200 MG tablet Take 200 mg by mouth 2 (two) times daily.   Yes [provider]  ACCU-CHEK AVIVA PLUS test strip  06/23/19   [provider]  Accu-Chek Softclix Lancets lancets  04/07/19   [provider]  aspirin EC 81 MG tablet Take 81 mg by mouth daily. Patient not taking: Reported on 06/05/2021    [provider]  bisoprolol-hydrochlorothiazide [facility]) 5-6.25 MG per tablet Take 1 tablet by mouth daily. Patient not taking: Reported on 06/05/2021    [provider]  Blood Glucose Monitoring Suppl (ACCU-CHEK AVIVA PLUS) w/Device KIT  04/07/19   [provider]  budesonide-formoterol (SYMBICORT) 160-4.5 MCG/ACT inhaler Inhale 2 puffs into the lungs 2 (two) times daily. Patient not taking: No sig reported    [provider]  fluticasone (FLOVENT HFA) 110 MCG/ACT inhaler Inhale 2 puffs into the lungs daily. Patient not taking: No sig reported 10/31/15   Gean Quint, MD  glyBURIDE-metformin [facility]) 1.25-250 MG per tablet Take 1 tablet by mouth daily with breakfast. Patient  not taking: Reported on 06/05/2021    [provider]  HYDROcodone-acetaminophen (NORCO/VICODIN) 5-325 MG per tablet Take 1 tablet by mouth every 4 (four) hours as needed for pain. Patient not taking: No sig reported 03/06/13   Mylinda Latina, MD  neomycin-polymyxin-hydrocortisone (CORTISPORIN) OTIC solution Apply 2 drops to the ingrown toenail site twice daily. Cover with band-aid. 07/30/19   Evelina Bucy, DPM  omeprazole (PRILOSEC) 40 MG capsule Take 40 mg by mouth daily. Patient not taking: Reported on 06/05/2021    [provider]  penicillin v potassium (VEETID) 500 MG tablet Take 1 tablet (  500 mg total) by mouth 3 (three) times daily. Patient not taking: No sig reported 03/06/13   Mylinda Latina, MD    Allergies    Patient has no known allergies.  Review of Systems   Review of Systems  Constitutional:  Negative for chills and fever.  HENT:  Negative for congestion.   Eyes:  Negative for pain.  Respiratory:  Negative for cough and shortness of breath.   Cardiovascular:  Negative for chest pain and leg swelling.  Gastrointestinal:  Negative for abdominal pain, diarrhea, nausea and vomiting.  Genitourinary:  Negative for dysuria, flank pain, frequency and urgency.  Musculoskeletal:  Positive for back pain. Negative for myalgias.  Skin:  Negative for rash.  Neurological:  Negative for dizziness and headaches.   Physical Exam Updated Vital Signs BP 125/70 (BP Location: Right Arm)   Pulse 80   Temp 98.2 F (36.8 C) (Oral)   Resp 18   SpO2 95%   Physical Exam Vitals and nursing note reviewed.  Constitutional:      Comments: Uncomfortable appearing 75 year old female in no acute distress.  HENT:     Head: Normocephalic and atraumatic.     Nose: Nose normal.  Eyes:     General: No scleral icterus. Cardiovascular:     Rate and Rhythm: Normal rate and regular rhythm.     Pulses: Normal pulses.     Heart sounds: Normal heart sounds.  Pulmonary:     Effort:  Pulmonary effort is normal. No respiratory distress.     Breath sounds: No wheezing.  Abdominal:     Palpations: Abdomen is soft.     Tenderness: There is no abdominal tenderness. There is no guarding or rebound.     Comments: Abdomen is soft and nontender with deep palpation.  Musculoskeletal:     Cervical back: Normal range of motion.     Right lower leg: No edema.     Left lower leg: No edema.     Comments: Tenderness palpation of the lumbar spine in the midline.  There is also some diffuse paralumbar muscular tenderness.  No bruising step-off or deformity. Bilateral knee is are atraumatic appearing.  No instability on examination she is able to move both legs.  Skin:    General: Skin is warm and dry.     Capillary Refill: Capillary refill takes less than 2 seconds.  Neurological:     Mental Status: She is alert. Mental status is at baseline.  Psychiatric:        Mood and Affect: Mood normal.        Behavior: Behavior normal.    ED Results / Procedures / Treatments   Labs (all labs ordered are listed, but only abnormal results are displayed) Labs Reviewed  URINALYSIS, ROUTINE W REFLEX MICROSCOPIC    EKG None  Radiology CT Lumbar Spine Wo Contrast  Result Date: 06/05/2021 CLINICAL DATA:  Low back pain.  Fell in shower 2 weeks ago. EXAM: CT LUMBAR SPINE WITHOUT CONTRAST TECHNIQUE: Multidetector CT imaging of the lumbar spine was performed without intravenous contrast administration. Multiplanar CT image reconstructions were also generated. COMPARISON:  Lumbar spine radiographs 12/31/2018 FINDINGS: Segmentation: 5 lumbar type vertebrae. Alignment: Facet mediated anterolisthesis of L4 on L5 measuring 10 mm. Vertebrae: No fracture or suspicious osseous lesion. Paraspinal and other soft tissues: Small nonobstructing right renal calculi. Mild abdominal aortic atherosclerosis without aneurysm. Disc levels: T11-12: Asymmetric right-sided disc space narrowing and degenerative endplate  changes with vacuum disc. Disc bulging, a right  foraminal disc protrusion, and mild-to-moderate facet arthrosis result in severe right neural foraminal stenosis without spinal stenosis. T12-L1 and L1-2: Mild facet arthrosis without evidence of significant stenosis. L2-3: Mild disc bulging and facet arthrosis without evidence of significant stenosis. L3-4: Disc bulging and mild facet arthrosis result in mild bilateral neural foraminal stenosis without significant spinal stenosis. L4-5: Advanced disc degeneration with severe disc space narrowing, extensive degenerative vertebral sclerosis, and vacuum disc. Anterolisthesis with bulging uncovered disc, disc space height loss, ligamentum flavum hypertrophy, and severe facet arthrosis result in severe spinal stenosis and moderate to severe bilateral neural foraminal stenosis. Potential bilateral L4 and L5 nerve root impingement. L5-S1: Mild disc space narrowing with vacuum disc. Disc bulging and mild-to-moderate facet arthrosis result in moderate bilateral neural foraminal stenosis without evidence of significant spinal stenosis. IMPRESSION: 1. No acute osseous abnormality identified. 2. 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 3. Moderate bilateral neural foraminal stenosis at L5-S1. 4. Severe right neural foraminal stenosis at T11-12. 5. Nonobstructing right nephrolithiasis. 6. Aortic Atherosclerosis (ICD10-I70.0). Electronically Signed   By: Logan Bores M.D.   On: 06/05/2021 16:08   DG HIPS BILAT WITH PELVIS MIN 5 VIEWS  Result Date: 06/05/2021 CLINICAL DATA:  Hip pain after fall 2 weeks ago. EXAM: DG HIP (WITH OR WITHOUT PELVIS) 5+V BILAT COMPARISON:  June 01, 2019. FINDINGS: There is no evidence of hip fracture or dislocation. There is no evidence of arthropathy or other focal bone abnormality. IMPRESSION: Negative. Electronically Signed   By: Marijo Conception M.D.   On: 06/05/2021 15:41    Procedures Procedures   Medications Ordered in ED Medications  lidocaine (LIDODERM) 5 % 1 patch (1 patch Transdermal Patch Applied 06/05/21 1714)  acetaminophen (TYLENOL) tablet 650 mg (650 mg Oral Given 06/05/21 1713)    ED Course  I have reviewed the triage vital signs and the nursing notes.  Pertinent labs & imaging results that were available during my care of the patient were reviewed by me and considered in my medical decision making (see chart for details).    MDM Rules/Calculators/A&P                          Patient is a 75 year old female with past medical history detailed in HPI she presented today with low back pain seems to be across her entire low back.  She has a history of trauma on the 25th of last month approximately 10 days ago at this point.  She has not had any imaging done of her spine.  She states that it is achy constant worse with movement and touch.  She states that she has had some pain since her fall the 25th but states that it got much worse this morning when she woke up and try to get out of bed.  She denies any urinary symptoms no abdominal pain nausea or vomiting or diarrhea no changes with bowel movements.  She states she went to urgent care and was told to come to the ER for a CT scan.  On physical exam patient seems to have some diffuse low back tenderness to palpation.  No CVA tenderness.  She is overall well-appearing has no objective numbness and moves both legs off the bed without difficulty.  Will obtain CT scan of lumbar spine and bilateral hip and pelvis x-ray rule out fracture.  Ultimately low suspicion for  this.  Given her age and trauma however imaging is indicated.  Doubt intra-abdominal pathology as cause of her symptoms.  CT imaging without any evidence of fracture.  She does have some L4-L5 disc and facet degeneration with some severe spinal stenosis which may be causing some of her symptoms.  Will  recommend follow-up with neurosurgery/orthopedics.  Conservative therapy with Voltaren gel, Lidoderm and Tylenol.  I discussed this case with my attending physician who cosigned this note including patient's presenting symptoms, physical exam, and planned diagnostics and interventions. Attending physician stated agreement with plan or made changes to plan which were implemented.   Attending physician assessed patient at bedside.   Patient understanding of plan.  Ambulates prior to discharge with her cane which is baseline for her.  All questions answered best my ability.   Final Clinical Impression(s) / ED Diagnoses Final diagnoses:  Acute midline low back pain without sciatica    Rx / DC Orders ED Discharge Orders          Ordered    diclofenac Sodium (VOLTAREN) 1 % GEL  4 times daily        06/05/21 1721    lidocaine (LIDODERM) 5 %  Every 24 hours        06/05/21 1721    acetaminophen (TYLENOL) 500 MG tablet  Every 6 hours PRN        06/05/21 1721    methocarbamol (ROBAXIN) 500 MG tablet  2 times daily PRN        06/05/21 1734             Tedd Sias, [place] 06/05/21 Einar Crow    Noemi Chapel, MD 06/07/21 [phone]

## 2021-06-05 NOTE — ED Notes (Signed)
ED Provider at bedside. 

## 2021-06-05 NOTE — ED Notes (Signed)
Patient transported to X-ray 

## 2021-06-19 DIAGNOSIS — I10 Essential (primary) hypertension: Secondary | ICD-10-CM | POA: Diagnosis not present

## 2021-06-19 DIAGNOSIS — M130 Polyarthritis, unspecified: Secondary | ICD-10-CM | POA: Diagnosis not present

## 2021-06-19 DIAGNOSIS — E1169 Type 2 diabetes mellitus with other specified complication: Secondary | ICD-10-CM | POA: Diagnosis not present

## 2021-06-20 ENCOUNTER — Other Ambulatory Visit: Payer: Self-pay

## 2021-06-20 ENCOUNTER — Ambulatory Visit (HOSPITAL_COMMUNITY)
Admission: RE | Admit: 2021-06-20 | Discharge: 2021-06-20 | Disposition: A | Discharge status: Home / Self Care | Payer: Medicare HMO | Source: Ambulatory Visit | Attending: Family Medicine | Admitting: Family Medicine

## 2021-06-20 ENCOUNTER — Other Ambulatory Visit (HOSPITAL_COMMUNITY): Payer: Self-pay | Admitting: Family Medicine

## 2021-06-20 DIAGNOSIS — E1169 Type 2 diabetes mellitus with other specified complication: Secondary | ICD-10-CM | POA: Diagnosis not present

## 2021-06-20 DIAGNOSIS — J45909 Unspecified asthma, uncomplicated: Secondary | ICD-10-CM | POA: Diagnosis not present

## 2021-06-20 DIAGNOSIS — R059 Cough, unspecified: Secondary | ICD-10-CM | POA: Diagnosis not present

## 2021-06-20 DIAGNOSIS — R0602 Shortness of breath: Secondary | ICD-10-CM

## 2021-06-20 DIAGNOSIS — I10 Essential (primary) hypertension: Secondary | ICD-10-CM | POA: Diagnosis not present

## 2021-06-20 DIAGNOSIS — M130 Polyarthritis, unspecified: Secondary | ICD-10-CM | POA: Diagnosis not present

## 2021-07-31 DIAGNOSIS — I517 Cardiomegaly: Secondary | ICD-10-CM | POA: Diagnosis not present

## 2021-07-31 DIAGNOSIS — E1169 Type 2 diabetes mellitus with other specified complication: Secondary | ICD-10-CM | POA: Diagnosis not present

## 2021-07-31 DIAGNOSIS — E785 Hyperlipidemia, unspecified: Secondary | ICD-10-CM | POA: Diagnosis not present

## 2021-08-08 ENCOUNTER — Other Ambulatory Visit (HOSPITAL_COMMUNITY): Payer: Self-pay | Admitting: Family Medicine

## 2021-08-08 ENCOUNTER — Other Ambulatory Visit: Payer: Self-pay | Admitting: Family Medicine

## 2021-08-08 DIAGNOSIS — I509 Heart failure, unspecified: Secondary | ICD-10-CM

## 2021-08-28 ENCOUNTER — Other Ambulatory Visit: Payer: Self-pay

## 2021-08-28 ENCOUNTER — Ambulatory Visit (HOSPITAL_COMMUNITY)
Admission: RE | Admit: 2021-08-28 | Discharge: 2021-08-28 | Disposition: A | Discharge status: Home / Self Care | Payer: Medicare HMO | Source: Ambulatory Visit | Attending: Family Medicine | Admitting: Family Medicine

## 2021-08-28 DIAGNOSIS — I509 Heart failure, unspecified: Secondary | ICD-10-CM | POA: Insufficient documentation

## 2021-08-28 LAB — ECHOCARDIOGRAM COMPLETE
Area-P 1/2: 2.45 cm2
S' Lateral: 2.6 cm

## 2021-08-28 NOTE — Progress Notes (Signed)
*  PRELIMINARY RESULTS* Echocardiogram 2D Echocardiogram has been performed.  Valerie Thomas 08/28/2021, 11:37 AM

## 2021-08-31 DIAGNOSIS — M130 Polyarthritis, unspecified: Secondary | ICD-10-CM | POA: Diagnosis not present

## 2021-08-31 DIAGNOSIS — E1169 Type 2 diabetes mellitus with other specified complication: Secondary | ICD-10-CM | POA: Diagnosis not present

## 2021-08-31 DIAGNOSIS — I10 Essential (primary) hypertension: Secondary | ICD-10-CM | POA: Diagnosis not present

## 2021-08-31 DIAGNOSIS — E039 Hypothyroidism, unspecified: Secondary | ICD-10-CM | POA: Diagnosis not present

## 2021-09-19 DIAGNOSIS — E782 Mixed hyperlipidemia: Secondary | ICD-10-CM | POA: Diagnosis not present

## 2021-09-19 DIAGNOSIS — R351 Nocturia: Secondary | ICD-10-CM | POA: Diagnosis not present

## 2021-09-19 DIAGNOSIS — E039 Hypothyroidism, unspecified: Secondary | ICD-10-CM | POA: Diagnosis not present

## 2021-09-19 DIAGNOSIS — I10 Essential (primary) hypertension: Secondary | ICD-10-CM | POA: Diagnosis not present

## 2021-09-25 DIAGNOSIS — Z0100 Encounter for examination of eyes and vision without abnormal findings: Secondary | ICD-10-CM | POA: Diagnosis not present

## 2021-09-25 DIAGNOSIS — H524 Presbyopia: Secondary | ICD-10-CM | POA: Diagnosis not present

## 2021-10-01 DIAGNOSIS — E1169 Type 2 diabetes mellitus with other specified complication: Secondary | ICD-10-CM | POA: Diagnosis not present

## 2021-10-01 DIAGNOSIS — I10 Essential (primary) hypertension: Secondary | ICD-10-CM | POA: Diagnosis not present

## 2021-10-01 DIAGNOSIS — E039 Hypothyroidism, unspecified: Secondary | ICD-10-CM | POA: Diagnosis not present

## 2021-10-31 DIAGNOSIS — E1169 Type 2 diabetes mellitus with other specified complication: Secondary | ICD-10-CM | POA: Diagnosis not present

## 2021-10-31 DIAGNOSIS — E039 Hypothyroidism, unspecified: Secondary | ICD-10-CM | POA: Diagnosis not present

## 2021-10-31 DIAGNOSIS — I10 Essential (primary) hypertension: Secondary | ICD-10-CM | POA: Diagnosis not present

## 2021-12-30 DIAGNOSIS — E1169 Type 2 diabetes mellitus with other specified complication: Secondary | ICD-10-CM | POA: Diagnosis not present

## 2021-12-30 DIAGNOSIS — I10 Essential (primary) hypertension: Secondary | ICD-10-CM | POA: Diagnosis not present

## 2021-12-30 DIAGNOSIS — M130 Polyarthritis, unspecified: Secondary | ICD-10-CM | POA: Diagnosis not present

## 2021-12-30 DIAGNOSIS — E039 Hypothyroidism, unspecified: Secondary | ICD-10-CM | POA: Diagnosis not present

## 2022-01-14 DIAGNOSIS — M130 Polyarthritis, unspecified: Secondary | ICD-10-CM | POA: Diagnosis not present

## 2022-01-14 DIAGNOSIS — E1169 Type 2 diabetes mellitus with other specified complication: Secondary | ICD-10-CM | POA: Diagnosis not present

## 2022-01-14 DIAGNOSIS — I10 Essential (primary) hypertension: Secondary | ICD-10-CM | POA: Diagnosis not present

## 2022-01-14 DIAGNOSIS — E785 Hyperlipidemia, unspecified: Secondary | ICD-10-CM | POA: Diagnosis not present

## 2022-01-14 DIAGNOSIS — E7800 Pure hypercholesterolemia, unspecified: Secondary | ICD-10-CM | POA: Diagnosis not present

## 2022-02-06 ENCOUNTER — Other Ambulatory Visit: Payer: Self-pay

## 2022-02-06 ENCOUNTER — Ambulatory Visit (INDEPENDENT_AMBULATORY_CARE_PROVIDER_SITE_OTHER): Payer: Medicare Other | Admitting: Nurse Practitioner

## 2022-02-06 ENCOUNTER — Encounter: Payer: Self-pay | Admitting: Nurse Practitioner

## 2022-02-06 VITALS — BP 132/86 | HR 86 | Temp 97.3°F | Ht 62.0 in | Wt 172.6 lb

## 2022-02-06 DIAGNOSIS — R42 Dizziness and giddiness: Secondary | ICD-10-CM | POA: Insufficient documentation

## 2022-02-06 DIAGNOSIS — Z7689 Persons encountering health services in other specified circumstances: Secondary | ICD-10-CM

## 2022-02-06 DIAGNOSIS — Z13220 Encounter for screening for lipoid disorders: Secondary | ICD-10-CM | POA: Diagnosis not present

## 2022-02-06 DIAGNOSIS — Z1329 Encounter for screening for other suspected endocrine disorder: Secondary | ICD-10-CM | POA: Diagnosis not present

## 2022-02-06 DIAGNOSIS — E039 Hypothyroidism, unspecified: Secondary | ICD-10-CM | POA: Diagnosis not present

## 2022-02-06 DIAGNOSIS — H65111 Acute and subacute allergic otitis media (mucoid) (sanguinous) (serous), right ear: Secondary | ICD-10-CM | POA: Diagnosis not present

## 2022-02-06 DIAGNOSIS — J4520 Mild intermittent asthma, uncomplicated: Secondary | ICD-10-CM | POA: Diagnosis not present

## 2022-02-06 DIAGNOSIS — R220 Localized swelling, mass and lump, head: Secondary | ICD-10-CM

## 2022-02-06 MED ORDER — ALBUTEROL SULFATE HFA 108 (90 BASE) MCG/ACT IN AERS: 2.0000 | INHALATION_SPRAY | Freq: Four times a day (QID) | RESPIRATORY_TRACT | 2 refills | Status: DC | PRN

## 2022-02-06 NOTE — Progress Notes (Signed)
? ?Subjective:  ? ? Patient ID: Valerie Thomas, female    DOB: 1946-03-27, 76 y.o.   MRN: [id number] ? ?HPI ? ?76 year old female with history of diabetes, asthma, vertigo hypertension, hyperlipidemia, acid reflux, thyroid disease presents to clinic to establish care.   Patient states that her current PCP is in [place] and she would like to find something a little bit closer because she is getting up in age and does not want to drive as far. ? ?Patient has a complaint today with an ongoing issue with vertigo.  Patient states that she has had vertigo in the past several years ago but it resolved after she was treated for an ear infection.  Patient states that this current episode of vertigo started about a month.  Patient describes vertigo as intermittent in feeling like the world is spinning that is exacerbated when she bends her head down or moves fast.  Patient denies any changes to her vision, new onset headaches, weakness, drooping face, slurred speech, lightheadedness, syncope, ear pain, changes to her hearing.  However patient does admit to ringing in her ears that started last night, runny nose and some sinus congestion.  Patient would like to be evaluated for an ear infection today.  Patient denies any vertigo currently. ? ?Patient also states that over the past months she has noticed that she has minor shortness of breath when lying down at night that happens sporadically.  Patient states that she thinks is happened twice.  Patient has history of asthma but has not been using her albuterol, Symbicort, or Flovent because she has not felt the need. ? ? ?Review of Systems  ?HENT:  Positive for congestion, rhinorrhea, sinus pressure and tinnitus.   ?Neurological:  Positive for dizziness.  ?All other systems reviewed and are negative. ? ?   ?Objective:  ? Physical Exam ?Constitutional:   ?   General: She is not in acute distress. ?   Appearance: Normal appearance. She is normal weight. She is not  toxic-appearing.  ?HENT:  ?   Right Ear: Hearing, ear canal and external ear normal. No decreased hearing noted. No laceration, drainage, swelling or tenderness. A middle ear effusion is present. There is no impacted cerumen. No foreign body. No mastoid tenderness. No PE tube. No hemotympanum. Tympanic membrane is not injected, scarred, perforated, erythematous, retracted or bulging.  ?   Left Ear: Hearing, tympanic membrane, ear canal and external ear normal. No decreased hearing noted. No laceration, drainage, swelling or tenderness.  No middle ear effusion. There is no impacted cerumen. No foreign body. No mastoid tenderness. No PE tube. No hemotympanum. Tympanic membrane is not injected, scarred, perforated, erythematous, retracted or bulging.  ?   Mouth/Throat:  ?   Lips: Pink.  ?   Mouth: Mucous membranes are moist. No injury, lacerations, oral lesions or angioedema.  ?   Dentition: Abnormal dentition. Does not have dentures. Gum lesions present. No dental tenderness, gingival swelling, dental caries or dental abscesses.  ?   Pharynx: Oropharynx is clear. Uvula midline. No pharyngeal swelling, oropharyngeal exudate, posterior oropharyngeal erythema or uvula swelling.  ?   Tonsils: No tonsillar exudate or tonsillar abscesses.  ?   Comments: Swollen gums noted to patient's left upper jaw near left canine tooth.  Nontender to palpation. ? ?Missing molars noted to bilateral upper and lower jaw. ?Eyes:  ?   General: No visual field deficit or scleral icterus.    ?   Right eye: No discharge.     ?  Left eye: No discharge.  ?   Conjunctiva/sclera: Conjunctivae normal.  ?   Pupils: Pupils are equal, round, and reactive to light.  ?Cardiovascular:  ?   Rate and Rhythm: Normal rate and regular rhythm.  ?   Pulses: Normal pulses.  ?   Heart sounds: Normal heart sounds. No murmur heard. ?Pulmonary:  ?   Effort: Pulmonary effort is normal. No respiratory distress.  ?   Breath sounds: No wheezing.  ?Skin: ?   General: Skin  is warm.  ?   Capillary Refill: Capillary refill takes less than 2 seconds.  ?Neurological:  ?   General: No focal deficit present.  ?   Mental Status: She is alert and oriented to person, place, and time.  ?   Cranial Nerves: No cranial nerve deficit, dysarthria or facial asymmetry.  ?   Sensory: Sensation is intact.  ?   Motor: Motor function is intact. No weakness, tremor, atrophy, abnormal muscle tone or seizure activity.  ?   Coordination: Romberg sign positive. Coordination normal. Finger-Nose-Finger Test and Heel to [facility] Test normal. Rapid alternating movements normal.  ?   Gait: Gait is intact.  ?   Comments: Cranial nerves grossly intact.  However right eye shifted laterally during convergence test. ? ?Romberg test slightly positive.  Patient states that she felt dizzy during Romberg test.  ?Psychiatric:     ?   Mood and Affect: Mood normal.     ?   Behavior: Behavior normal.  ? ? ? ? ? ?   ?Assessment & Plan:  ? ?1. Encounter to establish care ?-Records review attempted.  However no records were transferred via [place]. ?-Patient signed release form for records to be requested from [facility] and Dr. Fransico Setters office. ?-We will get baseline labs today. ?- CBC with Differential ?- CMP14+EGFR ?- Lipid Panel With LDL/HDL Ratio ?- HgB A1c ?-TSH ?-Return to clinic in 1 week for full review of medical conditions and medications. ? ?2. Vertigo ?-Possibly related to allergic otitis media. ?-However findings on neuro exam slightly concerning for central nervous changes.  However I do not have patient records to refer to or to establish baseline. ?-Patient encouraged to go to neuro for evaluation.  Patient states that she does not want to see another doctor at this time.   ?-Through shared decision making, we will start patient on allergy medication and I will reassess patient in 1 week.  Informed patient that if she continues to have symptoms neurology consult will be placed.  Patient agreed. ?-Patient to go to the  store to pick up Zyrtec and use either saline nasal spray or Nettie pot for allergy symptom management. ?-Go to emergency room if you develop changes to vision, changes to hearing, facial drooping, weakness, slurred speech, headaches. ?-Return to clinic in 1 week ? ?3. Non-recurrent acute allergic otitis media of right ear ?-Vertigo possibly related to allergic otitis media of right ear. ?-Take Zyrtec once a day for allergy symptoms ?-Use Nettie pot or saline nasal spray for allergy symptoms ?-Return to clinic in 1 week ? ?4. Hypothyroidism, unspecified type ?-We will get baseline TSH. ?- TSH + free T4 ? ?5. Mild intermittent asthma without complication ?-Per patient she has history of asthma ?-Not currently using albuterol or other maintenance medication. ?-Rescue inhaler ordered for periods of shortness of breath or wheezing. ?- albuterol (VENTOLIN HFA) 108 (90 Base) MCG/ACT inhaler; Inhale 2 puffs into the lungs every 6 (six) hours as needed for wheezing or  shortness of breath.  Dispense: 8 g; Refill: 2 ?-Return to clinic in 1 week ? ?6. Swollen gums ?-Follow-up with dentist. ?-Use medicated mouthwash as prescribed by your dentist. ?-Return to clinic in 1 week ? ?  ?Note:  This document was prepared using Dragon voice recognition software and may include unintentional dictation errors. ? ?

## 2022-02-06 NOTE — Patient Instructions (Signed)
[facility] Cetirizine ? ?Nasal saline Spray or Neti Pot ? ? ?

## 2022-02-07 LAB — CBC WITH DIFFERENTIAL/PLATELET
Basophils Absolute: 0.1 10*3/uL (ref 0.0–0.2)
Basos: 1 %
EOS (ABSOLUTE): 0.1 10*3/uL (ref 0.0–0.4)
Eos: 1 %
Hematocrit: 40.9 % (ref 34.0–46.6)
Hemoglobin: 13 g/dL (ref 11.1–15.9)
Immature Grans (Abs): 0 10*3/uL (ref 0.0–0.1)
Immature Granulocytes: 0 %
Lymphocytes Absolute: 1.1 10*3/uL (ref 0.7–3.1)
Lymphs: 10 %
MCH: 27 pg (ref 26.6–33.0)
MCHC: 31.8 g/dL (ref 31.5–35.7)
MCV: 85 fL (ref 79–97)
Monocytes Absolute: 0.7 10*3/uL (ref 0.1–0.9)
Monocytes: 6 %
Neutrophils Absolute: 9.2 10*3/uL — ABNORMAL HIGH (ref 1.4–7.0)
Neutrophils: 82 %
Platelets: 179 10*3/uL (ref 150–450)
RBC: 4.81 x10E6/uL (ref 3.77–5.28)
RDW: 14 % (ref 11.7–15.4)
WBC: 11.2 10*3/uL — ABNORMAL HIGH (ref 3.4–10.8)

## 2022-02-07 LAB — CMP14+EGFR
ALT: 5 IU/L (ref 0–32)
AST: 12 IU/L (ref 0–40)
Albumin/Globulin Ratio: 1.8 (ref 1.2–2.2)
Albumin: 4.7 g/dL (ref 3.7–4.7)
Alkaline Phosphatase: 111 IU/L (ref 44–121)
BUN/Creatinine Ratio: 15 (ref 12–28)
BUN: 11 mg/dL (ref 8–27)
Bilirubin Total: 0.5 mg/dL (ref 0.0–1.2)
CO2: 24 mmol/L (ref 20–29)
Calcium: 9.4 mg/dL (ref 8.7–10.3)
Chloride: 101 mmol/L (ref 96–106)
Creatinine, Ser: 0.75 mg/dL (ref 0.57–1.00)
Globulin, Total: 2.6 g/dL (ref 1.5–4.5)
Glucose: 120 mg/dL — ABNORMAL HIGH (ref 70–99)
Potassium: 3.7 mmol/L (ref 3.5–5.2)
Sodium: 146 mmol/L — ABNORMAL HIGH (ref 134–144)
Total Protein: 7.3 g/dL (ref 6.0–8.5)
eGFR: 82 mL/min/{1.73_m2} (ref >59)

## 2022-02-07 LAB — LIPID PANEL WITH LDL/HDL RATIO
Cholesterol, Total: 116 mg/dL (ref 100–199)
HDL: 55 mg/dL (ref >39)
LDL Chol Calc (NIH): 45 mg/dL (ref 0–99)
LDL/HDL Ratio: 0.8 ratio (ref 0.0–3.2)
Triglycerides: 84 mg/dL (ref 0–149)
VLDL Cholesterol Cal: 16 mg/dL (ref 5–40)

## 2022-02-07 LAB — TSH+FREE T4
Free T4: 1.36 ng/dL (ref 0.82–1.77)
TSH: 0.552 u[IU]/mL (ref 0.450–4.500)

## 2022-02-07 LAB — HEMOGLOBIN A1C
Est. average glucose Bld gHb Est-mCnc: 148 mg/dL
Hgb A1c MFr Bld: 6.8 % — ABNORMAL HIGH (ref 4.8–5.6)

## 2022-02-13 ENCOUNTER — Encounter: Payer: Self-pay | Admitting: Nurse Practitioner

## 2022-02-13 ENCOUNTER — Ambulatory Visit (INDEPENDENT_AMBULATORY_CARE_PROVIDER_SITE_OTHER): Payer: Medicare Other | Admitting: Nurse Practitioner

## 2022-02-13 ENCOUNTER — Other Ambulatory Visit: Payer: Self-pay

## 2022-02-13 VITALS — BP 123/77 | HR 85 | Temp 97.9°F | Ht 62.0 in | Wt 175.0 lb

## 2022-02-13 DIAGNOSIS — J4520 Mild intermittent asthma, uncomplicated: Secondary | ICD-10-CM | POA: Diagnosis not present

## 2022-02-13 DIAGNOSIS — K219 Gastro-esophageal reflux disease without esophagitis: Secondary | ICD-10-CM | POA: Insufficient documentation

## 2022-02-13 DIAGNOSIS — R220 Localized swelling, mass and lump, head: Secondary | ICD-10-CM | POA: Diagnosis not present

## 2022-02-13 DIAGNOSIS — R194 Change in bowel habit: Secondary | ICD-10-CM | POA: Insufficient documentation

## 2022-02-13 DIAGNOSIS — E119 Type 2 diabetes mellitus without complications: Secondary | ICD-10-CM | POA: Diagnosis not present

## 2022-02-13 DIAGNOSIS — R42 Dizziness and giddiness: Secondary | ICD-10-CM

## 2022-02-13 DIAGNOSIS — Z1211 Encounter for screening for malignant neoplasm of colon: Secondary | ICD-10-CM | POA: Insufficient documentation

## 2022-02-13 DIAGNOSIS — I10 Essential (primary) hypertension: Secondary | ICD-10-CM | POA: Diagnosis not present

## 2022-02-13 DIAGNOSIS — E669 Obesity, unspecified: Secondary | ICD-10-CM | POA: Insufficient documentation

## 2022-02-13 NOTE — Progress Notes (Signed)
? ?Subjective:  ? ? Patient ID: Valerie Thomas, female    DOB: 10/17/1946, 76 y.o.   MRN: [id number] ? ?HPI ? ?76 year old female patient with history of asthma, hypertension, diabetes, thyroid disease, vertigo, and hyperlipidemia presents to clinic for follow-up on vertigo.  Patient was seen last week with complaints of vertigo and was diagnosed with allergic otitis media of the right ear and mild exacerbation of asthma.  Patient was prescribed Zyrtec and albuterol.  ? ?Today patient states that she feels a lot better.  She has not had any more episodes of vertigo.  Patient also states that she has not had to use her albuterol.  Patient concerned about the cost of albuterol as it cost over $50.  Patient want to know if her albuterol inhaler will be something that she needs all the time.   ? ?Patient also concerned about cost of Januvia.  Patient states that Januvia costs over $130 for 90-day supply pills. Patient was started on Januvia by previous provider.  Current A1c is 6.8.  Patient currently takes Januvia 100 mg daily and metformin 500 mg twice daily.   ? ?Patient states that she has started taking chlorhexidine gluconate mouthwash solution for her swollen gums.  Patient states that her dentist thinks that the swollen glands may be related to her Norvasc 10 mg. ? ?Patient has no other concerns.  Patient denies any slurred speech, weakness, dizziness, lightheadedness, chest pain, shortness of breath, facial asymmetry, changes to speech, changes to hearing.   ? ?Review of Systems  ?All other systems reviewed and are negative. ? ?   ?Objective:  ? Physical Exam ?Constitutional:   ?   General: She is not in acute distress. ?   Appearance: Normal appearance. She is obese. She is not ill-appearing, toxic-appearing or diaphoretic.  ?HENT:  ?   Right Ear: Tympanic membrane, ear canal and external ear normal. There is no impacted cerumen.  ?   Left Ear: Tympanic membrane, ear canal and external ear normal. There is no  impacted cerumen.  ?   Nose: Nose normal.  ?   Mouth/Throat:  ?   Mouth: Mucous membranes are dry.  ?Eyes:  ?   General: No visual field deficit. ?   Extraocular Movements: Extraocular movements intact.  ?   Pupils: Pupils are equal, round, and reactive to light.  ?Neck:  ?   Vascular: No carotid bruit.  ?Cardiovascular:  ?   Rate and Rhythm: Normal rate and regular rhythm.  ?   Pulses: Normal pulses.  ?   Heart sounds: Normal heart sounds. No murmur heard. ?Pulmonary:  ?   Effort: Pulmonary effort is normal. No respiratory distress.  ?   Breath sounds: Normal breath sounds. No stridor. No wheezing, rhonchi or rales.  ?Chest:  ?   Chest wall: No tenderness.  ?Musculoskeletal:     ?   General: Normal range of motion.  ?   Cervical back: Normal range of motion and neck supple. No rigidity or tenderness.  ?Lymphadenopathy:  ?   Cervical: No cervical adenopathy.  ?Skin: ?   General: Skin is warm.  ?   Capillary Refill: Capillary refill takes less than 2 seconds.  ?Neurological:  ?   General: No focal deficit present.  ?   Mental Status: She is alert and oriented to person, place, and time.  ?   Cranial Nerves: No cranial nerve deficit, dysarthria or facial asymmetry.  ?   Sensory: No sensory deficit.  ?  Motor: No weakness, tremor, atrophy, abnormal muscle tone or seizure activity.  ?   Coordination: Coordination normal.  ?   Gait: Gait normal.  ?Psychiatric:     ?   Mood and Affect: Mood normal.     ?   Behavior: Behavior normal.  ? ? ? ?   ?Assessment & Plan:  ? ?1. Vertigo ?-Vertigo seems to be resolved with use of Zyrtec. ?-Informed patient that if vertigo returns that a neurology evaluation would be warranted. ?Patient stated understanding ?-Reviewed signs symptoms of stroke.  Patient to go to emergency room if she experiences any signs symptoms of stroke. ?-Return to clinic 4 weeks ? ?2. Diabetes mellitus without complication (HCC) ?-G9J currently 6.8.  Goal A1c less than 7.5 met. ?-Patient currently taking  Januvia 100 mg daily, metformin 500 mg twice daily. ?-Patient concerned about the cost of Januvia.   ?-Patient states that she has approximately 42 more pills of Januvia left. ?-Patient states that she will see if she can continue to pay for Januvia. ?-We will discuss if Januvia remains a viable option for patient at next visit in 4 weeks. ?-If Januvia is not still an option we will consider increasing metformin to 750 mg/day.  We will also consider changing metformin to XR 750 once a day. ? ? ?3. Mild intermittent asthma without complication ?-Patient denies any recent episodes of shortness of breath. ?-Patient educated on the importance of having albuterol as a rescue inhaler with history of asthma. ?-Patient instructed to use albuterol as needed for wheezing or shortness of breath.  Patient stated understanding ?-Return to clinic in 4 weeks ? ?4. Swollen gums ?-Patient states that her dentist thinks that amlodipine may be the cause of her swollen gums.  However patient denies any other peripheral edema to lower extremities or the upper extremities. ?-Not convinced that amlodipine is causing patient's swollen gums at this time. ?-Patient to complete all 15 days of chlorhexidine mouthwash and swollen gums will be reassessed at that time. ?-If swollen gums persists will consider decreasing amlodipine to 5 mg and stopping losartan and starting valsartan 40 or 80 mg.  We will keep hydrochlorothiazide 25 mg.  Depending on cost may consider a combo drug on valsartan 80 mg and hydrochlorothiazide 25 mg. ? ?5. Primary hypertension ?-Blood pressure currently well controlled at 123/77. ?-If I need to decrease amlodipine to 5 mg.  We will consider stopping losartan and starting valsartan 40 mg or 80 mg.  And continuing with hydrochlorothiazide 25 mg.  Depending on cost we may also consider combo drug of valsartan and hydrochlorothiazide. ?-Return to clinic in 4 weeks. ? ?  ?Note:  This document was prepared using Dragon  voice recognition software and may include unintentional dictation errors. ? ? ? ?

## 2022-02-26 ENCOUNTER — Other Ambulatory Visit (HOSPITAL_COMMUNITY): Payer: Self-pay | Admitting: Nurse Practitioner

## 2022-02-26 DIAGNOSIS — Z1231 Encounter for screening mammogram for malignant neoplasm of breast: Secondary | ICD-10-CM

## 2022-03-13 ENCOUNTER — Encounter: Payer: Self-pay | Admitting: Nurse Practitioner

## 2022-03-13 ENCOUNTER — Ambulatory Visit (INDEPENDENT_AMBULATORY_CARE_PROVIDER_SITE_OTHER): Payer: Medicare Other | Admitting: Nurse Practitioner

## 2022-03-13 VITALS — BP 138/84 | HR 84 | Temp 97.3°F | Wt 174.6 lb

## 2022-03-13 DIAGNOSIS — R220 Localized swelling, mass and lump, head: Secondary | ICD-10-CM

## 2022-03-13 DIAGNOSIS — E119 Type 2 diabetes mellitus without complications: Secondary | ICD-10-CM

## 2022-03-13 DIAGNOSIS — J4520 Mild intermittent asthma, uncomplicated: Secondary | ICD-10-CM | POA: Diagnosis not present

## 2022-03-13 DIAGNOSIS — I10 Essential (primary) hypertension: Secondary | ICD-10-CM

## 2022-03-13 IMAGING — MG MM DIGITAL DIAGNOSTIC UNILAT*R* W/ TOMO W/ CAD
4 series · 4 of 12 positions shown · non-contrast
Comparison: Previous exams.

CLINICAL DATA: Screening recall for possible right breast asymmetry
seen on the cc view only.

EXAM:
DIGITAL DIAGNOSTIC UNILATERAL RIGHT MAMMOGRAM WITH CAD AND TOMO

[R CC synth-2D]
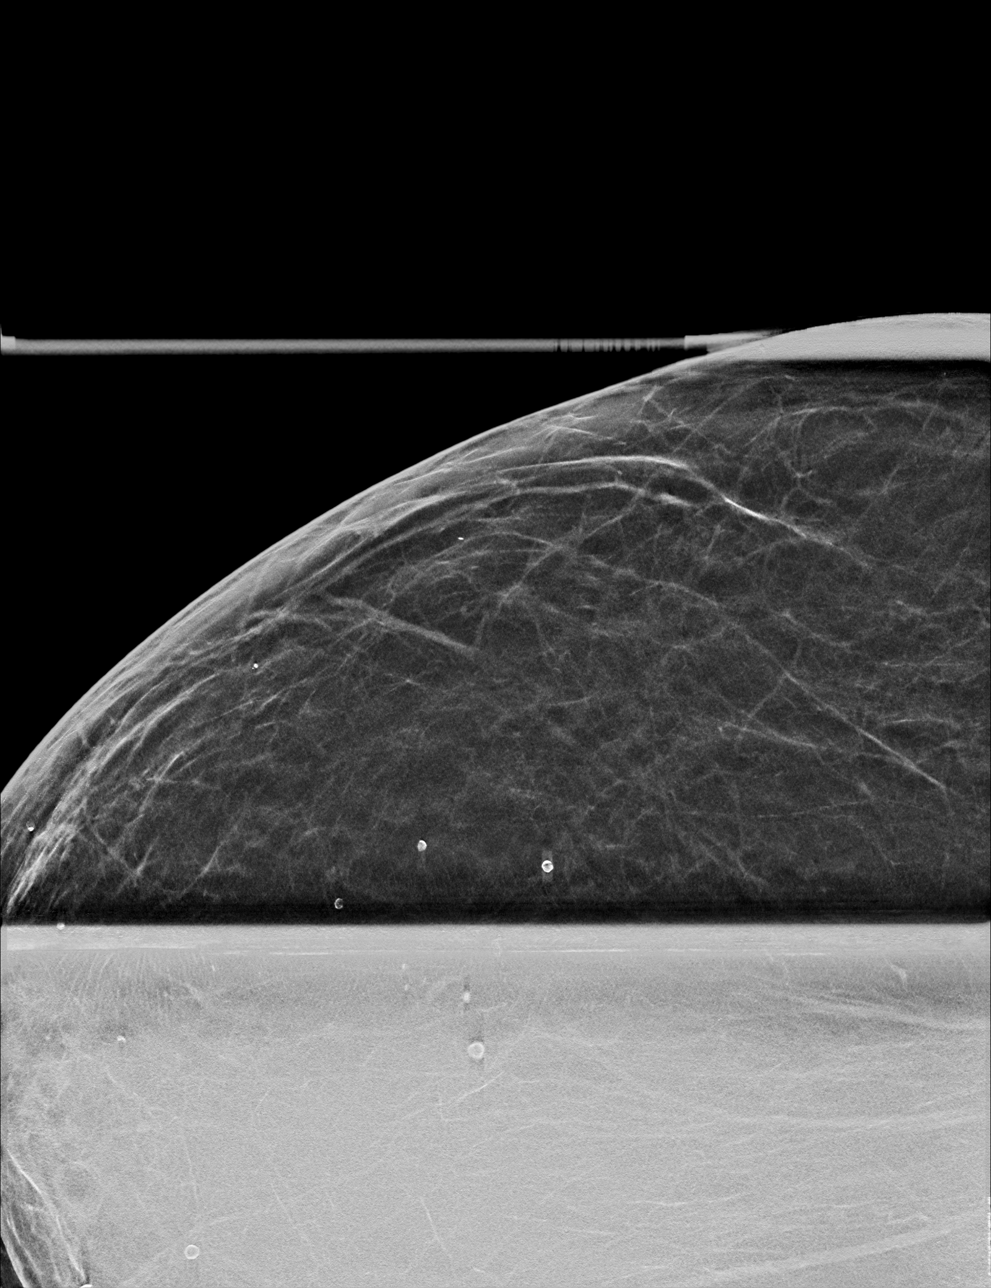

[R ML synth-2D]
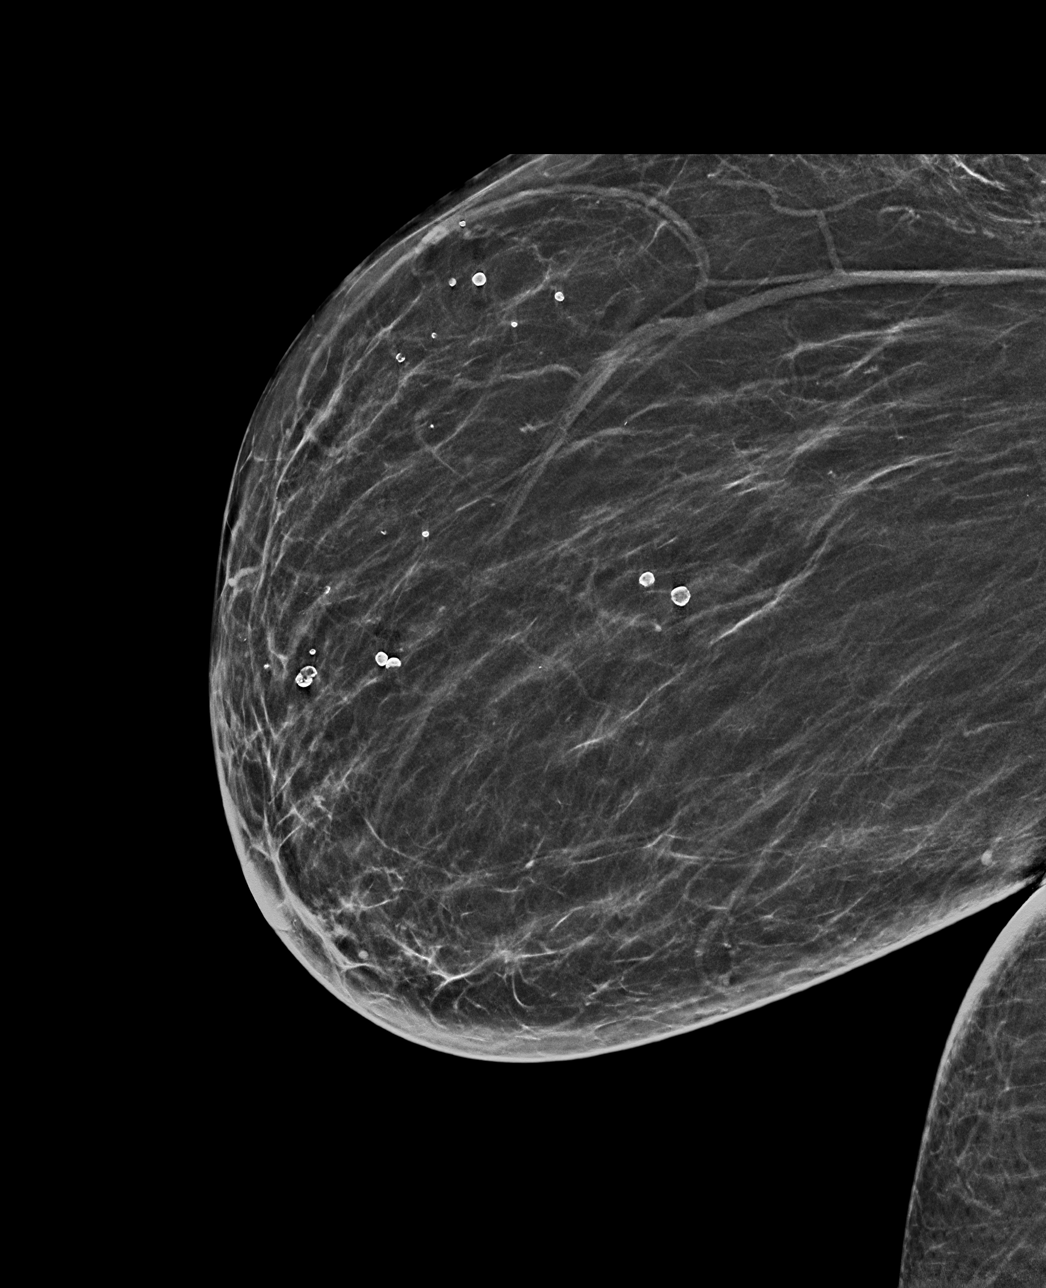

[R CC tomo · tomo slice 22/43.0]
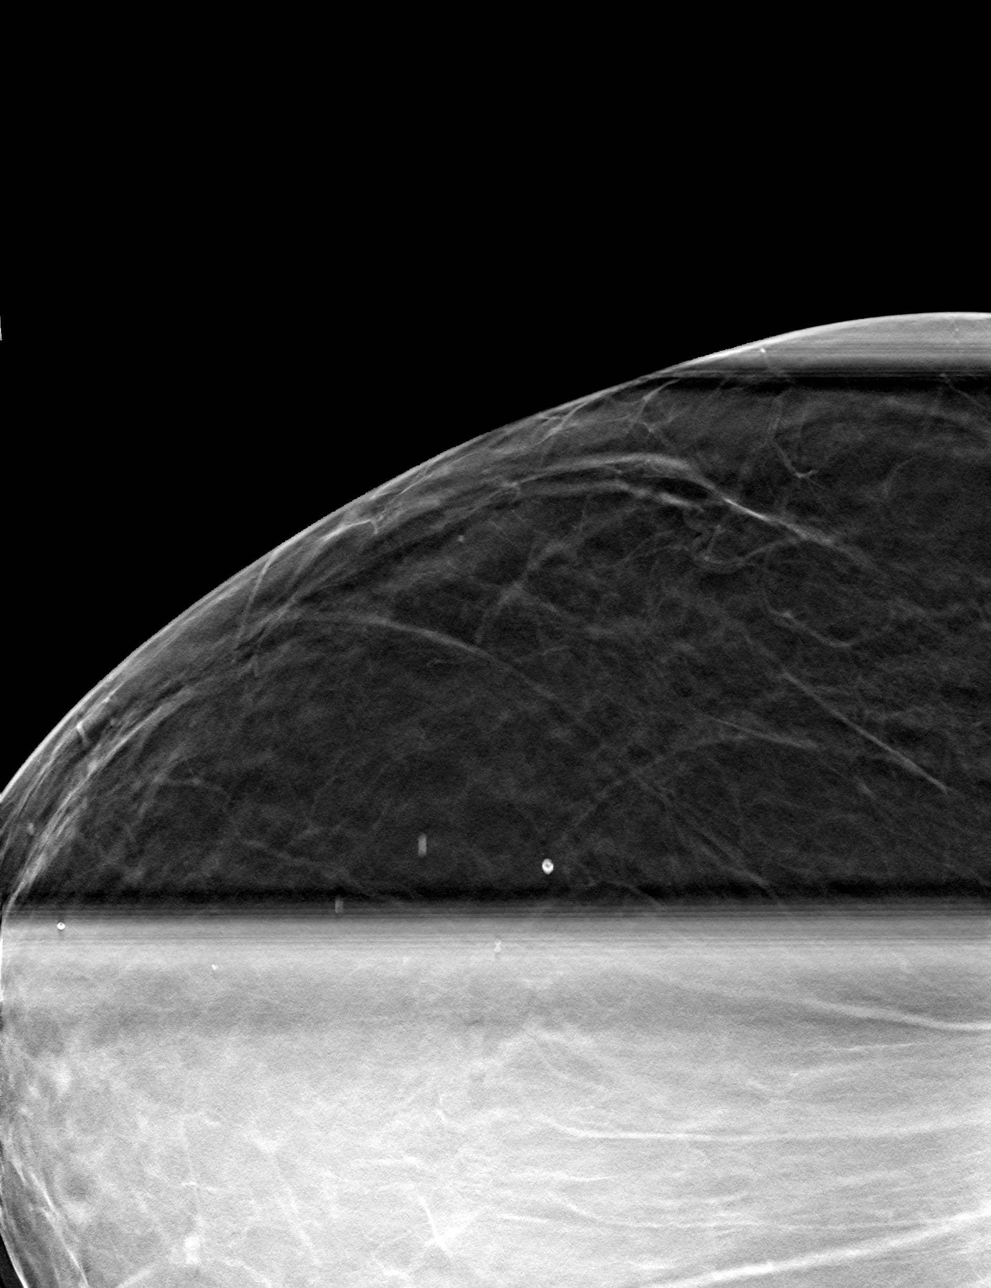

[R ML tomo · tomo slice 29/58.0]
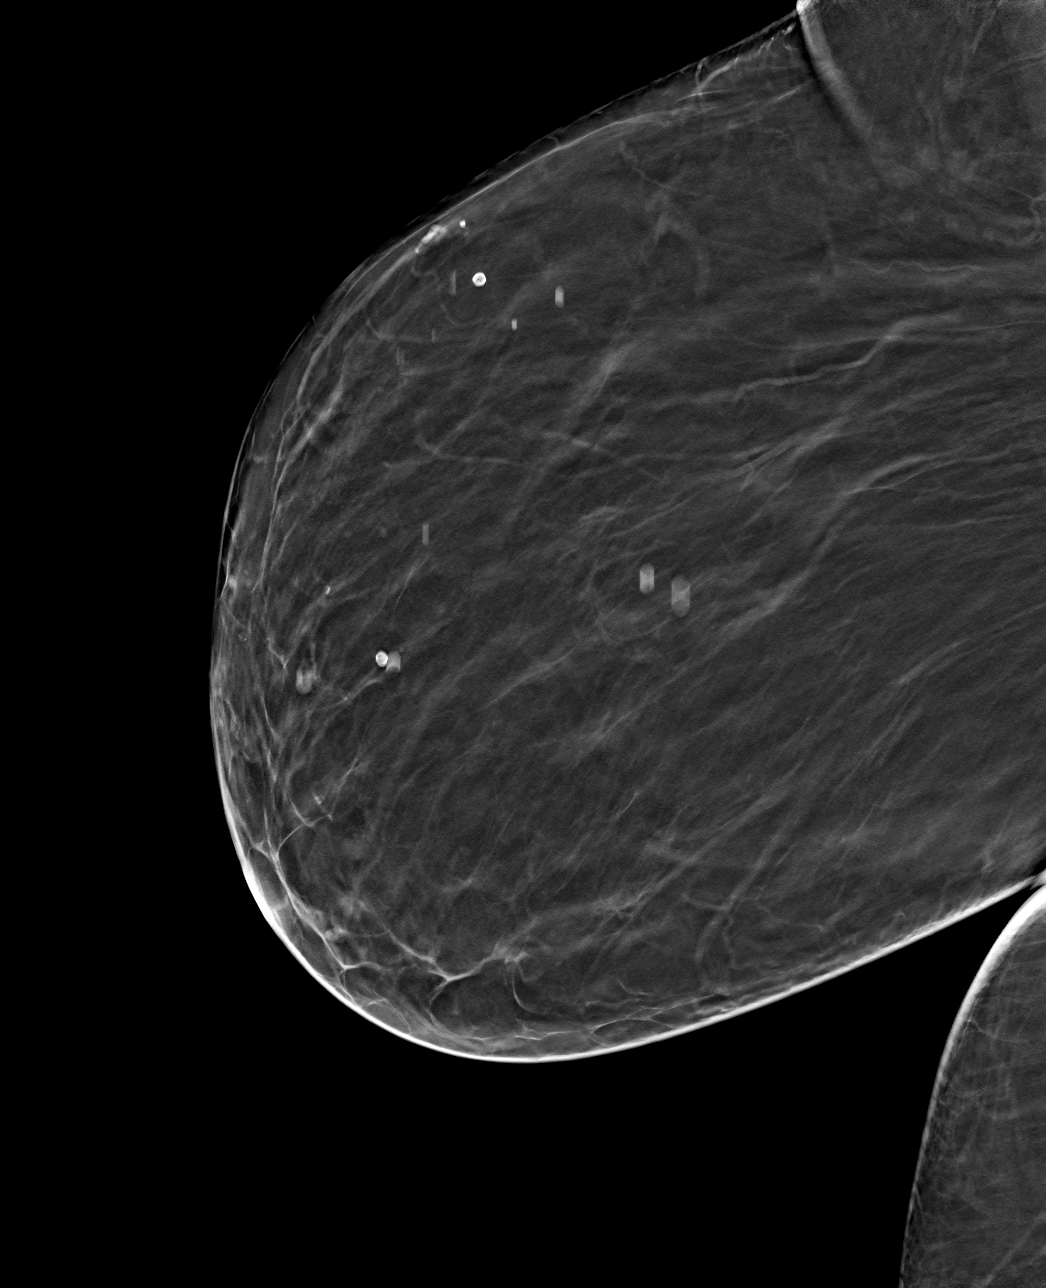

[4 of 12 positions shown; findings below may reference images not displayed]

ACR Breast Density Category b: There are scattered areas of
fibroglandular density.
FINDINGS: Additional tomograms were performed of the right breast. The
initially questioned possible right breast asymmetry resolves on the
additional imaging with findings compatible with an area of
overlapping fibroglandular tissue. There is no mammographic evidence
of malignancy in the right breast.

Mammographic images were processed with CAD.
IMPRESSION: No mammographic evidence of malignancy in the right breast.

RECOMMENDATION:
Screening mammogram in one year.(Code:KP-[id number])

I have discussed the findings and recommendations with the patient.
If applicable, a reminder letter will be sent to the patient
regarding the next appointment.

BI-RADS CATEGORY  1: Negative.

## 2022-03-13 MED ORDER — JANUVIA 100 MG PO TABS: 100.0000 mg | ORAL_TABLET | Freq: Every day | ORAL | 1 refills | Status: DC

## 2022-03-13 MED ORDER — VALSARTAN-HYDROCHLOROTHIAZIDE 160-25 MG PO TABS: 1.0000 | ORAL_TABLET | Freq: Every day | ORAL | 0 refills | Status: DC

## 2022-03-13 NOTE — Progress Notes (Signed)
Pt contacted and verbalized understanding.  

## 2022-03-13 NOTE — Progress Notes (Signed)
? ?Subjective:  ? ? Patient ID: Valerie Thomas, female    DOB: 15-Dec-1945, 76 y.o.   MRN: [id number] ? ?HPI ? ?76 year old patient with history of hypertension, asthma, thyroid disease, diabetes here in clinic for follow up on blood pressure and Diabetes.  ? 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  Dentist sent in a letter to provider requesting that calcium channel blockers be avoided if at all possible to see if gingival hyperplasia will resolve.   ? ?Patient also concerned about Januvia.  Januvia will 100 mg for 90-day supply will cost approximately $131.  Patient states that that is a lot of money as she is on a fixed income.  However patient states that she will see what she can do.   ? ?Patient also has concerns today about albuterol.  Pt received a note from [employer] and her Albuterol needs to be changed to Albuterol HFA (PROAIR), Proair Respiclick, or Levalbuterol HFA.  ? ?Patient has no other concerns.  Patient denies chest pain, swelling, shortness of breath, difficulty breathing, or episodes of dizziness. ? ?Review of Systems  ?All other systems reviewed and are negative. ? ?   ?Objective:  ? Physical Exam ?Constitutional:   ?   General: She is not in acute distress. ?   Appearance: Normal appearance. She is obese. She is not ill-appearing, toxic-appearing or diaphoretic.  ?HENT:  ?   Head: Normocephalic and atraumatic.  ?   Mouth/Throat:  ?   Comments: Gums swollen around upper front teeth.  No other swelling noted. ?Cardiovascular:  ?   Rate and Rhythm: Normal rate and regular rhythm.  ?   Pulses: Normal pulses.  ?   Heart sounds: Normal heart sounds.  ?Pulmonary:  ?   Effort: Pulmonary effort is normal.  ?   Breath sounds: Normal breath sounds.  ?Musculoskeletal:     ?   General: No swelling.  ?   Right lower leg: No edema.  ?   Left lower leg: No edema.  ?   Comments: Grossly intact  ?Skin: ?   General: Skin is warm.  ?   Capillary Refill:  Capillary refill takes less than 2 seconds.  ?Neurological:  ?   Mental Status: She is alert.  ?   Comments: Grossly intact  ?Psychiatric:     ?   Mood and Affect: Mood normal.     ?   Behavior: Behavior normal.  ? ? ? ? ? ?   ?Assessment & Plan:  ? ?1. Diabetes mellitus without complication ([place]) ?-Patient concerned about the cost of Januvia.  Patient states that she is on a fixed income and not sure if she can continue to afford Januvia. ?-Was able to call patient's insurance provider and confirmed that 90-day supply Januvia is about $131. ?-Patient states that she will continue with Januvia for now. ?- JANUVIA 100 MG tablet; Take 1 tablet (100 mg total) by mouth daily.  Dispense: 90 tablet; Refill: 1 ?-Discussed glipizide as a possible alternative to Januvia if Januvia is getting too expensive for patient.  Discussed pros and cons of glipizide in detail.  Discussed risk of hypoglycemia with glipizide and the need to check blood sugars frequently. ?-Patient states that she had a reaction to some diabetic medication but unable to recall which one.  Patient states that she will bring a name of medication to the next visit. ?-Return to clinic in 4 weeks ? ?2. Primary hypertension ?-Due to  request from dentist will have patient's stop amlodipine. ?-Losartan/HCTZ is maxed out.  We will have patient stop losartan HCTZ combo medication and start valsartan '160mg'$  and hydrochlorothiazide 25 mg. ?- valsartan-hydrochlorothiazide (DIOVAN-HCT) 160-25 MG tablet; Take 1 tablet by mouth daily.  Dispense: 90 tablet; Refill: 0 ?-Patient to return to clinic in 4 weeks for blood pressure check. ?-We will have patient get lab work after having taken valsartan/HCTZ for approximately 10 days. ?-CMP placed. ? ?3. Swollen gums ?-Not truly convinced that amlodipine is causing gingival hyperplasia however I am willing to trial a different hypertension medication regimen to determine if symptoms get better without taking amlodipine. ?-Patient  to start valsartan hydrochlorothiazide 160-25 mg tablets. ?-Stop amlodipine. ?-Stop losartan/HCTZ. ?-Return to clinic in 4 weeks ? ? ?  ?Note:  This document was prepared using Dragon voice recognition software and may include unintentional dictation errors. ? ?

## 2022-03-13 NOTE — Patient Instructions (Addendum)
STOP taking Losartan/HCTZ '100mg'$ /'25mg'$  ?STOP taking Amlodipine '10mg'$  ? ?START Valsartan/HCTZ '180mg'$ /'25mg'$  ? ?Continue with Januvia '100mg'$  ? ?Come back to see me in 4 weeks. Will recheck labs at this time as well.  ? ?Monitor Blood pressure at home. If Blood pressure consisently over 140/90 please come in to be seen sooner. Also, monitor blood pressures for low blood pressures and for signs of low blood pressure (lightheadedness, dizziness, fatigue, or overall not feeling well). ? ?See you in 4 weeks for BP check.  ?

## 2022-03-27 ENCOUNTER — Ambulatory Visit (HOSPITAL_COMMUNITY)
Admission: RE | Admit: 2022-03-27 | Discharge: 2022-03-27 | Disposition: A | Discharge status: Home / Self Care | Payer: Medicare Other | Source: Ambulatory Visit | Attending: Nurse Practitioner | Admitting: Nurse Practitioner

## 2022-03-27 DIAGNOSIS — Z1231 Encounter for screening mammogram for malignant neoplasm of breast: Secondary | ICD-10-CM | POA: Insufficient documentation

## 2022-03-27 DIAGNOSIS — E119 Type 2 diabetes mellitus without complications: Secondary | ICD-10-CM | POA: Diagnosis not present

## 2022-03-27 DIAGNOSIS — I10 Essential (primary) hypertension: Secondary | ICD-10-CM | POA: Diagnosis not present

## 2022-03-28 LAB — CMP14+EGFR
ALT: 7 IU/L (ref 0–32)
AST: 18 IU/L (ref 0–40)
Albumin/Globulin Ratio: 1.8 (ref 1.2–2.2)
Albumin: 4.4 g/dL (ref 3.7–4.7)
Alkaline Phosphatase: 106 IU/L (ref 44–121)
BUN/Creatinine Ratio: 17 (ref 12–28)
BUN: 12 mg/dL (ref 8–27)
Bilirubin Total: 0.6 mg/dL (ref 0.0–1.2)
CO2: 22 mmol/L (ref 20–29)
Calcium: 9 mg/dL (ref 8.7–10.3)
Chloride: 103 mmol/L (ref 96–106)
Creatinine, Ser: 0.7 mg/dL (ref 0.57–1.00)
Globulin, Total: 2.4 g/dL (ref 1.5–4.5)
Glucose: 134 mg/dL — ABNORMAL HIGH (ref 70–99)
Potassium: 3.6 mmol/L (ref 3.5–5.2)
Sodium: 144 mmol/L (ref 134–144)
Total Protein: 6.8 g/dL (ref 6.0–8.5)
eGFR: 89 mL/min/{1.73_m2} (ref >59)

## 2022-04-03 ENCOUNTER — Other Ambulatory Visit: Payer: Self-pay | Admitting: *Deleted

## 2022-04-03 MED ORDER — METFORMIN HCL 500 MG PO TABS: 500.0000 mg | ORAL_TABLET | Freq: Two times a day (BID) | ORAL | 0 refills | Status: DC

## 2022-04-10 ENCOUNTER — Ambulatory Visit (INDEPENDENT_AMBULATORY_CARE_PROVIDER_SITE_OTHER): Payer: Medicare Other | Admitting: Nurse Practitioner

## 2022-04-10 ENCOUNTER — Encounter: Payer: Self-pay | Admitting: Nurse Practitioner

## 2022-04-10 VITALS — BP 150/80 | HR 79 | Temp 97.8°F | Ht 62.0 in | Wt 170.0 lb

## 2022-04-10 DIAGNOSIS — J4520 Mild intermittent asthma, uncomplicated: Secondary | ICD-10-CM

## 2022-04-10 DIAGNOSIS — I10 Essential (primary) hypertension: Secondary | ICD-10-CM | POA: Diagnosis not present

## 2022-04-10 MED ORDER — ALBUTEROL SULFATE HFA 108 (90 BASE) MCG/ACT IN AERS: 2.0000 | INHALATION_SPRAY | Freq: Four times a day (QID) | RESPIRATORY_TRACT | 0 refills | Status: DC | PRN

## 2022-04-10 NOTE — Progress Notes (Signed)
? ?Subjective:  ? ? Patient ID: Valerie Thomas, female    DOB: 05-22-1946, 76 y.o.   MRN: [id number] ? ?HPI ?76 year old female patient here for blood pressure check.  Patient was previously on amlodipine 5 mg along with losartan and HCTZ.  The patient's dentist was concerned about gingival hyperplasia and recommended that amlodipine be stopped to see if gingival hyperplasia will get better.   ? ?Amlodipine 5 mg was stopped and losartan and hydrochlorothiazide was changed to valsartan 160 mg and hydrochlorothiazide 25 mg. ? ?Patient states that she feels that her gums have gone down a bit after stopping amlodipine. ? ?Patient states that her blood pressures have been running around 130s to 140s over 90s at home.  Patient states that she is not sure if she trust her blood pressure cuff at home but states that she will try to get a better blood pressure cuff at home. Patient does admit to eating a lot more chips and ice cream over the past month.   ? ?Patient denies any chest pain, shortness of breath, difficulty breathing heart palpitations or feet swelling. ? ? ?Review of Systems  ?All other systems reviewed and are negative. ? ?   ?Objective:  ? Physical Exam ?Vitals reviewed.  ?Constitutional:   ?   General: She is not in acute distress. ?   Appearance: Normal appearance. She is obese. She is not ill-appearing, toxic-appearing or diaphoretic.  ?HENT:  ?   Head: Normocephalic and atraumatic.  ?Cardiovascular:  ?   Rate and Rhythm: Normal rate and regular rhythm.  ?   Pulses: Normal pulses.  ?   Heart sounds: Normal heart sounds. No murmur heard. ?Pulmonary:  ?   Effort: Pulmonary effort is normal. No respiratory distress.  ?   Breath sounds: Normal breath sounds. No wheezing.  ?Musculoskeletal:  ?   Comments: Grossly intact  ?Skin: ?   General: Skin is warm.  ?   Capillary Refill: Capillary refill takes less than 2 seconds.  ?Neurological:  ?   Mental Status: She is alert.  ?   Comments: Grossly intact  ?Psychiatric:      ?   Mood and Affect: Mood normal.     ?   Behavior: Behavior normal.  ? ? ? ? ? ?   ?Assessment & Plan:  ? ?1. Primary hypertension ?-Patient blood pressure today was initially 150/80.  On recheck it was 148/82.  Goal of blood pressure is less than 140/90 not met today ?-Offered to increase patient's valsartan to 320 mg however patient declined stating that she would rather try to change her diet first. ?-Patient to return to clinic in 2 months for another blood pressure recheck.  If blood pressure still not satisfactory at that time we will increase patient's valsartan to 320 mg. ? ?2. Mild intermittent asthma without complication ?-Patient states that insurance will not pay for Ventolin in albuterol however they will pay for ProAir.  Sent in new prescription asking for insurance company to use ProAir instead of Ventolin. ?- albuterol (VENTOLIN HFA) 108 (90 Base) MCG/ACT inhaler; Inhale 2 puffs into the lungs every 6 (six) hours as needed for wheezing or shortness of breath.  Dispense: 8 g; Refill: 0 ? ?  ?Note:  This document was prepared using Dragon voice recognition software and may include unintentional dictation errors. ?Note - This record has been created using [employer].  ?Chart creation errors have been sought, but may not always  ?have been located. Such creation  errors do not reflect on  ?the standard of medical care. ? ? ?

## 2022-04-10 NOTE — Patient Instructions (Signed)
Blood Pressure GOAL less than 140/90. ? ?

## 2022-04-23 ENCOUNTER — Ambulatory Visit (INDEPENDENT_AMBULATORY_CARE_PROVIDER_SITE_OTHER): Payer: Medicare Other | Admitting: Nurse Practitioner

## 2022-04-23 ENCOUNTER — Telehealth: Payer: Self-pay | Admitting: *Deleted

## 2022-04-23 ENCOUNTER — Encounter: Payer: Self-pay | Admitting: Nurse Practitioner

## 2022-04-23 VITALS — BP 168/80 | HR 83 | Temp 97.2°F | Wt 169.8 lb

## 2022-04-23 DIAGNOSIS — I10 Essential (primary) hypertension: Secondary | ICD-10-CM

## 2022-04-23 DIAGNOSIS — L309 Dermatitis, unspecified: Secondary | ICD-10-CM

## 2022-04-23 MED ORDER — TRIAMCINOLONE ACETONIDE 0.025 % EX OINT: 1.0000 "application " | TOPICAL_OINTMENT | Freq: Two times a day (BID) | CUTANEOUS | 0 refills | Status: DC

## 2022-04-23 MED ORDER — AMLODIPINE BESYLATE 2.5 MG PO TABS: 5.0000 mg | ORAL_TABLET | Freq: Every day | ORAL | 0 refills | Status: DC

## 2022-04-23 MED ORDER — PREDNISONE 20 MG PO TABS: 20.0000 mg | ORAL_TABLET | Freq: Every day | ORAL | 0 refills | Status: AC

## 2022-04-23 NOTE — Progress Notes (Unsigned)
   Subjective:    Patient ID: Valerie Thomas, female    DOB: 10-08-1946, 76 y.o.   MRN: [id number]  HPI Pt has been breaking out in something; unsure of what it is. Pt has been working out in garden. Arms, back, stomach and seat are broken out. Very itching. Has been using OTC anti itch and oatmeal bath. Head has also been itching. Pt had shingles shot on 04/10/22. Pt states she had a headache last week and that is not normal.    Review of Systems     Objective:   Physical Exam        Assessment & Plan:

## 2022-04-23 NOTE — Telephone Encounter (Signed)
Patient states she was told to call back if she had to pay for the cream that was prescribed today.  She said it cost $4 and something.

## 2022-04-24 ENCOUNTER — Encounter: Payer: Self-pay | Admitting: Nurse Practitioner

## 2022-04-24 NOTE — Telephone Encounter (Signed)
Patient says she will just use the cream that was sent in yesterday.

## 2022-04-30 ENCOUNTER — Encounter: Payer: Self-pay | Admitting: Family Medicine

## 2022-04-30 ENCOUNTER — Ambulatory Visit (INDEPENDENT_AMBULATORY_CARE_PROVIDER_SITE_OTHER): Payer: Medicare Other | Admitting: Family Medicine

## 2022-04-30 VITALS — BP 133/83 | HR 87 | Temp 98.4°F | Wt 169.2 lb

## 2022-04-30 DIAGNOSIS — L508 Other urticaria: Secondary | ICD-10-CM | POA: Insufficient documentation

## 2022-04-30 DIAGNOSIS — L509 Urticaria, unspecified: Secondary | ICD-10-CM | POA: Insufficient documentation

## 2022-04-30 MED ORDER — CETIRIZINE HCL 10 MG PO TABS: 10.0000 mg | ORAL_TABLET | Freq: Every day | ORAL | 0 refills | Status: DC

## 2022-04-30 MED ORDER — FAMOTIDINE 20 MG PO TABS: 20.0000 mg | ORAL_TABLET | Freq: Two times a day (BID) | ORAL | 0 refills | Status: DC

## 2022-04-30 MED ORDER — PREDNISONE 10 MG PO TABS
ORAL_TABLET | ORAL | 0 refills | Status: DC
Start: 2022-04-30 — End: 2022-06-23

## 2022-04-30 NOTE — Assessment & Plan Note (Signed)
Patient's clinical exam is consistent with urticaria.  Placing on Zyrtec, Pepcid, and additional prednisone taper (10 days).  Referring to allergy.

## 2022-04-30 NOTE — Progress Notes (Signed)
Subjective:  Patient ID: Valerie Thomas, female    DOB: 1946/10/03  Age: 76 y.o. MRN: [id number]  CC: Chief Complaint  Patient presents with   Rash    Pt was seen by Barbee Shropshire last week for rash on legs, back and chest. Pt arrives today with rash all over including scalp; left eyelid swollen. Triamcinolone cream does help but sometime the areas are bigger.      HPI:  76 year old female presents for evaluation of the above.  Patient recently seen for rash.  Placed on triamcinolone and oral prednisone.  Patient reports that her rash continues to persist and is worsening.  Her rash is located diffusely and is raised and itchy.  She states that this all started after she was outdoors in the garden.  Unsure of any contacts that she has not encountered previously.  No relieving factors.  No shortness of breath.  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   Obesity 02/13/2022   Gastroesophageal reflux disease 02/13/2022   Colon cancer screening 02/13/2022   Change in bowel habit 02/13/2022   Diabetes mellitus without complication ([place]) 15/40/0867   Hypertension 02/13/2022   Vertigo 02/06/2022    Social Hx   Social History   Socioeconomic History   Marital status: Widowed    Spouse name: Not on file   Number of children: Not on file   Years of education: Not on file   Highest education level: Not on file  Occupational History   Not on file  Tobacco Use   Smoking status: Never   Smokeless tobacco: Never  Substance and Sexual Activity   Alcohol use: No   Drug use: No   Sexual activity: Never  Other Topics Concern   Not on file  Social History Narrative   Not on file   Social Determinants of Health   Financial Resource Strain: Not on file  Food Insecurity: Not on file  Transportation Needs: Not on file  Physical Activity: Not on file  Stress: Not on file  Social Connections: Not on file    Review  of Systems Per HPI  Objective:  BP 133/83   Pulse 87   Temp 98.4 F (36.9 C)   Wt 169 lb 3.2 oz (76.7 kg)   SpO2 98%   BMI 30.95 kg/m      04/30/2022    3:59 PM 04/23/2022   11:13 AM 04/23/2022   10:56 AM  BP/Weight  Systolic BP 619 509 326  Diastolic BP 83 80 89  Wt. (Lbs) 169.2  169.8  BMI 30.95 kg/m2  31.06 kg/m2    Physical Exam Vitals and nursing note reviewed.  Constitutional:      General: She is not in acute distress.    Appearance: Normal appearance.  HENT:     Head: Normocephalic and atraumatic.  Eyes:     General:        Right eye: No discharge.        Left eye: No discharge.     Conjunctiva/sclera: Conjunctivae normal.  Pulmonary:     Effort: Pulmonary effort is normal. No respiratory distress.  Skin:    Comments: Right use, erythematous patches consistent with urticaria.  Neurological:     Mental Status: She is alert.  Psychiatric:        Mood and Affect: Mood normal.        Behavior: Behavior normal.  Lab Results  Component Value Date   WBC 11.2 (H) 02/06/2022   HGB 13.0 02/06/2022   HCT 40.9 02/06/2022   PLT 179 02/06/2022   GLUCOSE 134 (H) 03/27/2022   CHOL 116 02/06/2022   TRIG 84 02/06/2022   HDL 55 02/06/2022   LDLCALC 45 02/06/2022   ALT 7 03/27/2022   AST 18 03/27/2022   NA 144 03/27/2022   K 3.6 03/27/2022   CL 103 03/27/2022   CREATININE 0.70 03/27/2022   BUN 12 03/27/2022   CO2 22 03/27/2022   TSH 0.552 02/06/2022   HGBA1C 6.8 (H) 02/06/2022     Assessment & Plan:   Problem List Items Addressed This Visit       Musculoskeletal and Integument   Urticaria - Primary    Patient's clinical exam is consistent with urticaria.  Placing on Zyrtec, Pepcid, and additional prednisone taper (10 days).  Referring to allergy.       Relevant Orders   Ambulatory referral to Allergy    Meds ordered this encounter  Medications   cetirizine (ZYRTEC) 10 MG tablet    Sig: Take 1 tablet (10 mg total) by mouth daily.     Dispense:  30 tablet    Refill:  0   famotidine (PEPCID) 20 MG tablet    Sig: Take 1 tablet (20 mg total) by mouth 2 (two) times daily.    Dispense:  60 tablet    Refill:  0   predniSONE (DELTASONE) 10 MG tablet    Sig: 50 mg daily x 2 days, then 40 mg daily x 2 days, then 30 mg daily x 2 days, then 20 mg daily x 2 days, then 10 mg daily x 2 days.    Dispense:  30 tablet    Refill:  [place]

## 2022-04-30 NOTE — Patient Instructions (Signed)
Medication as prescribed.  Referral placed to allergist.  Take care  Dr. Lacinda Axon

## 2022-05-15 ENCOUNTER — Telehealth: Payer: Self-pay | Admitting: *Deleted

## 2022-05-15 DIAGNOSIS — J4520 Mild intermittent asthma, uncomplicated: Secondary | ICD-10-CM

## 2022-05-15 NOTE — Telephone Encounter (Signed)
Patient state called and stated she has used all her albuterol inhaler you gave her last month and she needs to get a script for a new one to [facility] in [place]

## 2022-05-16 ENCOUNTER — Ambulatory Visit: Payer: Self-pay | Admitting: Nurse Practitioner

## 2022-05-16 MED ORDER — ALBUTEROL SULFATE HFA 108 (90 BASE) MCG/ACT IN AERS: 2.0000 | INHALATION_SPRAY | Freq: Four times a day (QID) | RESPIRATORY_TRACT | 0 refills | Status: DC | PRN

## 2022-05-16 NOTE — Telephone Encounter (Signed)
Prescription sent electronically to pharmacy. Patient notified. 

## 2022-05-16 NOTE — Telephone Encounter (Signed)
[facility], Trenton Gammon, NP     Please place an order for her albuterol to [facility]. Please ensure that it is "Pro-Air" albuterol as patient's insurance will only pay for Pro-Air.   Thank you   Barbee Shropshire

## 2022-05-18 ENCOUNTER — Other Ambulatory Visit: Payer: Self-pay | Admitting: Nurse Practitioner

## 2022-05-18 DIAGNOSIS — J4520 Mild intermittent asthma, uncomplicated: Secondary | ICD-10-CM

## 2022-05-18 DIAGNOSIS — I10 Essential (primary) hypertension: Secondary | ICD-10-CM

## 2022-05-28 ENCOUNTER — Ambulatory Visit (INDEPENDENT_AMBULATORY_CARE_PROVIDER_SITE_OTHER): Payer: Medicare Other

## 2022-05-28 VITALS — Ht 62.0 in | Wt 169.0 lb

## 2022-05-28 DIAGNOSIS — Z599 Problem related to housing and economic circumstances, unspecified: Secondary | ICD-10-CM | POA: Diagnosis not present

## 2022-05-28 DIAGNOSIS — Z0000 Encounter for general adult medical examination without abnormal findings: Secondary | ICD-10-CM | POA: Diagnosis not present

## 2022-05-28 DIAGNOSIS — E119 Type 2 diabetes mellitus without complications: Secondary | ICD-10-CM | POA: Diagnosis not present

## 2022-05-28 NOTE — Progress Notes (Addendum)
Subjective:   Valerie Thomas is a 76 y.o. female who presents for an Initial Medicare Annual Wellness Visit. Virtual Visit via Telephone Note  I connected with  Valerie Thomas on 05/28/22 at  2:30 PM EDT by telephone and verified that I am speaking with the correct person using two identifiers.  Location: Patient: HOME Provider: [facility] Persons participating in the virtual visit: patient/Nurse Health Advisor   I discussed the limitations, risks, security and privacy concerns of performing an evaluation and management service by telephone and the availability of in person appointments. The patient expressed understanding and agreed to proceed.  Interactive audio and video telecommunications were attempted between this nurse and patient, however failed, due to patient having technical difficulties OR patient did not have access to video capability.  We continued and completed visit with audio only.  Some vital signs may be absent or patient reported.   Valerie Driver, LPN  Review of Systems     Cardiac Risk Factors include: advanced age (>22mn, >>71women);diabetes mellitus;hypertension;sedentary lifestyle;obesity (BMI >30kg/m2)     Objective:    Today's Vitals   05/28/22 1425  Weight: 169 lb (76.7 kg)  Height: 5' 2"  (1.575 m)   Body mass index is 30.91 kg/m.     05/28/2022    2:37 PM 06/05/2021    2:48 PM 06/22/2020    9:55 AM  Advanced Directives  Does Patient Have a Medical Advance Directive? Yes No No  Type of AParamedicof AWickesLiving will    Copy of HEbonyin Chart? No - copy requested    Would patient like information on creating a medical advance directive?  No - Patient declined Yes (MAU/Ambulatory/Procedural Areas - Information given)    Current Medications (verified) Outpatient Encounter Medications as of 05/28/2022  Medication Sig   ACCU-CHEK AVIVA PLUS test strip    Accu-Chek Softclix Lancets lancets     acetaminophen (TYLENOL) 500 MG tablet Take 1-2 tablets (500-1,000 mg total) by mouth every 6 (six) hours as needed.   albuterol (VENTOLIN HFA) 108 (90 Base) MCG/ACT inhaler USE 2 INHALATIONS BY MOUTH EVERY 6 HOURS AS NEEDED FOR WHEEZING  OR SHORTNESS OF BREATH   amLODipine (NORVASC) 2.5 MG tablet Take 2 tablets (5 mg total) by mouth daily.   aspirin EC 81 MG tablet Take 81 mg by mouth daily.   Blood Glucose Monitoring Suppl (ACCU-CHEK AVIVA PLUS) w/Device KIT    cetirizine (ZYRTEC) 10 MG tablet Take 1 tablet (10 mg total) by mouth daily.   chlorhexidine gluconate, MEDLINE KIT, (PERIDEX) 0.12 % solution 15 mL orally 2 times a day for 15 days   Cod Liver Oil CAPS Take 1 capsule by mouth daily.   EPINEPHrine (EPIPEN 2-PAK) 0.3 mg/0.3 mL IJ SOAJ injection Inject 0.3 mLs (0.3 mg total) into the muscle once.   famotidine (PEPCID) 20 MG tablet Take 1 tablet (20 mg total) by mouth 2 (two) times daily.   gabapentin (NEURONTIN) 300 MG capsule Take 300 mg by mouth 2 (two) times daily.   JANUVIA 100 MG tablet Take 1 tablet (100 mg total) by mouth daily.   levothyroxine (SYNTHROID, LEVOTHROID) 100 MCG tablet Take 100 mcg by mouth daily before breakfast.   metFORMIN (GLUCOPHAGE) 500 MG tablet TAKE 1 TABLET BY MOUTH TWICE  DAILY WITH A MEAL   montelukast (SINGULAIR) 10 MG tablet Take 10 mg by mouth daily.   Polyethyl Glycol-Propyl Glycol (SYSTANE OP) Apply 1 drop to eye  daily as needed (dry eyes).   potassium chloride SA (K-DUR,KLOR-CON) 20 MEQ tablet Take 20 mEq by mouth 2 (two) times daily.   predniSONE (DELTASONE) 10 MG tablet 50 mg daily x 2 days, then 40 mg daily x 2 days, then 30 mg daily x 2 days, then 20 mg daily x 2 days, then 10 mg daily x 2 days.   simvastatin (ZOCOR) 40 MG tablet Take 40 mg by mouth daily.   sulindac (CLINORIL) 200 MG tablet Take 200 mg by mouth 2 (two) times daily.   valsartan-hydrochlorothiazide (DIOVAN-HCT) 160-25 MG tablet TAKE 1 TABLET BY MOUTH DAILY   No  facility-administered encounter medications on file as of 05/28/2022.    Allergies (verified) Patient has no known allergies.   History: Past Medical History:  Diagnosis Date   Acid reflux    Asthma    Diabetes mellitus without complication ([place])    Eczema    Hypercholesteremia    Hypertension    Thyroid disease    Past Surgical History:  Procedure Laterality Date   ABDOMINAL HYSTERECTOMY     HAND SURGERY     THYROID SURGERY     torn ligament     neck surgery    UTERINE FIBROID SURGERY     Family History  Problem Relation Age of Onset   Asthma Sister    Allergic rhinitis Neg Hx    Urticaria Neg Hx    Eczema Neg Hx    Social History   Socioeconomic History   Marital status: Widowed    Spouse name: Not on file   Number of children: Not on file   Years of education: Not on file   Highest education level: Not on file  Occupational History   Not on file  Tobacco Use   Smoking status: Never   Smokeless tobacco: Never  Substance and Sexual Activity   Alcohol use: No   Drug use: No   Sexual activity: Never  Other Topics Concern   Not on file  Social History Narrative   Not on file   Social Determinants of Health   Financial Resource Strain: High Risk (05/28/2022)   Overall Financial Resource Strain (CARDIA)    Difficulty of Paying Living Expenses: Hard  Food Insecurity: No Food Insecurity (05/28/2022)   Hunger Vital Sign    Worried About Running Out of Food in the Last Year: Never true    Ran Out of Food in the Last Year: Never true  Transportation Needs: No Transportation Needs (05/28/2022)   PRAPARE - Hydrologist (Medical): No    Lack of Transportation (Non-Medical): No  Physical Activity: Insufficiently Active (05/28/2022)   Exercise Vital Sign    Days of Exercise per Week: 3 days    Minutes of Exercise per Session: 30 min  Stress: No Stress Concern Present (05/28/2022)   [place]    Feeling of Stress : Not at all  Social Connections: [place] (05/28/2022)   Social Connection and Isolation Panel [NHANES]    Frequency of Communication with Friends and Family: More than three times a week    Frequency of Social Gatherings with Friends and Family: More than three times a week    Attends Religious Services: More than 4 times per year    Active Member of [employer] or Organizations: Yes    Attends Archivist Meetings: More than 4 times per year    Marital Status:  Married    Tobacco Counseling Counseling given: Not Answered   Clinical Intake:  Pre-visit preparation completed: Yes  Pain : No/denies pain     BMI - recorded: 30.91 Nutritional Status: BMI > 30  Obese Nutritional Risks: None Diabetes: Yes  How often do you need to have someone help you when you read instructions, pamphlets, or other written materials from your doctor or pharmacy?: 1 - Never  Diabetic?Nutrition Risk Assessment:  Has the patient had any N/V/D within the last 2 months?  No  Does the patient have any non-healing wounds?  No  Has the patient had any unintentional weight loss or weight gain?  No   Diabetes:  Is the patient diabetic?  Yes  If diabetic, was a CBG obtained today?  No  Did the patient bring in their glucometer from home?  No  How often do you monitor your CBG's? BID.   Financial Strains and Diabetes Management:  Are you having any financial strains with the device, your supplies or your medication? Yes .  Januvia Does the patient want to be seen by Chronic Care Management for management of their diabetes?  No  Would the patient like to be referred to a Nutritionist or for Diabetic Management?  No   Diabetic Exams:  Diabetic Eye Exam: Completed 2022.  Pt has been advised about the importance in completing this exam.Diabetic Foot Exam: Completed due. Pt has been advised about the importance in completing this exam. Interpreter  Needed?: No  Information entered by :: mj Carlita Whitcomb, lpn   Activities of Daily Living    05/28/2022    2:41 PM  In your present state of health, do you have any difficulty performing the following activities:  Hearing? 0  Vision? 0  Difficulty concentrating or making decisions? 0  Walking or climbing stairs? 0  Dressing or bathing? 0  Doing errands, shopping? 0  Preparing Food and eating ? N  Using the Toilet? N  In the past six months, have you accidently leaked urine? Y  Do you have problems with loss of bowel control? N  Managing your Medications? N  Managing your Finances? N  Housekeeping or managing your Housekeeping? N    Patient Care Team: [facility], Trenton Gammon, NP as PCP - General (Nurse Practitioner)  Indicate any recent Medical Services you may have received from other than [facility] providers in the past year (date may be approximate).     Assessment:   This is a routine wellness examination for [place].  Hearing/Vision screen Hearing Screening - Comments:: Some hearing issues.  Vision Screening - Comments:: Glasses. [facility] 2022.  Dietary issues and exercise activities discussed: Current Exercise Habits: The patient does not participate in regular exercise at present, Exercise limited by: cardiac condition(s)   Goals Addressed             This Visit's Progress    Exercise 3x per week (30 min per time)       Continue to stay active and healthy.       Depression Screen    05/28/2022    2:31 PM 02/06/2022    9:42 AM  PHQ 2/9 Scores  PHQ - 2 Score 0 0    Fall Risk    05/28/2022    2:39 PM 02/06/2022    9:42 AM  [place] in the past year? 1 0  Number falls in past yr: 0   Injury with Fall? 1   Risk  for fall due to : History of fall(s);Impaired balance/gait History of fall(s)  Follow up Falls prevention discussed Falls evaluation completed    FALL RISK PREVENTION PERTAINING TO THE HOME:  Any stairs in or around the home? Yes  If so,  are there any without handrails? No  Home free of loose throw rugs in walkways, pet beds, electrical cords, etc? Yes  Adequate lighting in your home to reduce risk of falls? Yes   ASSISTIVE DEVICES UTILIZED TO PREVENT FALLS:  Life alert? Yes  Use of a cane, walker or w/c? No  Grab bars in the bathroom? Yes  Shower chair or bench in shower? Yes  Elevated toilet seat or a handicapped toilet? Yes   TIMED UP AND GO:  Was the test performed? No .  PHONE VISIT.  Cognitive Function:        05/28/2022    2:41 PM  6CIT Screen  What Year? 0 points  What month? 0 points  What time? 0 points  Count back from 20 0 points  Months in reverse 0 points  Repeat phrase 4 points  Total Score 4 points    Immunizations Immunization History  Administered Date(s) Administered   Fluad Quad(high Dose 65+) 09/03/2021   Influenza, High Dose Seasonal PF 08/16/2019   Influenza-Unspecified 09/01/2018   Moderna Covid-19 Vaccine Bivalent Booster 55yr & up 09/28/2021   Moderna Sars-Covid-2 Vaccination 12/24/2019, 01/19/2020, 09/26/2020, 06/19/2021   PNEUMOCOCCAL CONJUGATE-20 09/03/2021   Tdap 03/06/2013    TDAP status: Up to date  Flu Vaccine status: Up to date  Pneumococcal vaccine status: Up to date  Covid-19 vaccine status: Completed vaccines  Qualifies for Shingles Vaccine? Yes   Zostavax completed Yes   Shingrix Completed?: No.    Education has been provided regarding the importance of this vaccine. Patient has been advised to call insurance company to determine out of pocket expense if they have not yet received this vaccine. Advised may also receive vaccine at local pharmacy or Health Dept. Verbalized acceptance and understanding.  Screening Tests Health Maintenance  Topic Date Due   OPHTHALMOLOGY EXAM  Never done   Zoster Vaccines- Shingrix (1 of 2) Never done   COVID-19 Vaccine (6 - Moderna series) 01/29/2022   FOOT EXAM  08/01/2022 (Originally 01/01/1956)   Hepatitis C  Screening  08/01/2022 (Originally 01/01/1964)   INFLUENZA VACCINE  07/02/2022   HEMOGLOBIN A1C  08/09/2022   TETANUS/TDAP  03/07/2023   Pneumonia Vaccine 76 Years old  Completed   DEXA SCAN  Completed   HPV VACCINES  Aged Out    Health Maintenance  Health Maintenance Due  Topic Date Due   OPHTHALMOLOGY EXAM  Never done   Zoster Vaccines- Shingrix (1 of 2) Never done   COVID-19 Vaccine (6 - Moderna series) 01/29/2022    Colorectal cancer screening: No longer required.   Mammogram status: Completed 03/27/2022. Repeat every year  Bone Density status: Completed 05/04/2004. Results reflect: Bone density results: NORMAL. Repeat every 5 years.  Lung Cancer Screening: (Low Dose CT Chest recommended if Age 77-80years, 30 pack-year currently smoking OR have quit w/in 15years.) does not qualify.   Additional Screening:  Hepatitis C Screening: does qualify; Completed DUE  Vision Screening: Recommended annual ophthalmology exams for early detection of glaucoma and other disorders of the eye. Is the patient up to date with their annual eye exam?  Yes  Who is the provider or what is the name of the office in which the patient attends annual eye  exams? [facility] If pt is not established with a provider, would they like to be referred to a provider to establish care? No .   Dental Screening: Recommended annual dental exams for proper oral hygiene  Community Resource Referral / Chronic Care Management: CRR required this visit?  No   CCM required this visit?  No      Plan:     I have personally reviewed and noted the following in the patient's chart:   Medical and social history Use of alcohol, tobacco or illicit drugs  Current medications and supplements including opioid prescriptions. Patient is not currently taking opioid prescriptions. Functional ability and status Nutritional status Physical activity Advanced directives List of other physicians Hospitalizations,  surgeries, and ER visits in previous 12 months Vitals Screenings to include cognitive, depression, and falls Referrals and appointments  In addition, I have reviewed and discussed with patient certain preventive protocols, quality metrics, and best practice recommendations. A written personalized care plan for preventive services as well as general preventive health recommendations were provided to patient.     Valerie Driver, LPN   01/30/3142   Nurse Notes: Discussed colonoscopy. Pt declined repeat. Second Shingrix due 06/11/2022 and pt is aware.  Pt c/o issues with being able to afford Januvia. Discussed CCM referral for assistance. Pt is agreeable and referral made.

## 2022-05-29 NOTE — Addendum Note (Signed)
Addended by: Randal Buba K on: 05/29/2022 08:52 AM   Modules accepted: Orders

## 2022-06-06 ENCOUNTER — Ambulatory Visit (INDEPENDENT_AMBULATORY_CARE_PROVIDER_SITE_OTHER): Payer: Medicare Other | Admitting: Nurse Practitioner

## 2022-06-06 ENCOUNTER — Encounter: Payer: Self-pay | Admitting: Nurse Practitioner

## 2022-06-06 VITALS — BP 138/70 | HR 84 | Temp 97.2°F | Ht 62.0 in | Wt 169.0 lb

## 2022-06-06 DIAGNOSIS — E119 Type 2 diabetes mellitus without complications: Secondary | ICD-10-CM | POA: Diagnosis not present

## 2022-06-06 DIAGNOSIS — W57XXXA Bitten or stung by nonvenomous insect and other nonvenomous arthropods, initial encounter: Secondary | ICD-10-CM | POA: Diagnosis not present

## 2022-06-06 DIAGNOSIS — E039 Hypothyroidism, unspecified: Secondary | ICD-10-CM

## 2022-06-06 DIAGNOSIS — I10 Essential (primary) hypertension: Secondary | ICD-10-CM | POA: Diagnosis not present

## 2022-06-06 DIAGNOSIS — E6609 Other obesity due to excess calories: Secondary | ICD-10-CM | POA: Diagnosis not present

## 2022-06-06 DIAGNOSIS — S0096XA Insect bite (nonvenomous) of unspecified part of head, initial encounter: Secondary | ICD-10-CM

## 2022-06-06 DIAGNOSIS — Z6830 Body mass index (BMI) 30.0-30.9, adult: Secondary | ICD-10-CM

## 2022-06-06 NOTE — Patient Instructions (Signed)
Benadryl Ointment/Gel for itching.

## 2022-06-06 NOTE — Progress Notes (Addendum)
Subjective:    Patient ID: Valerie Thomas, female    DOB: June 07, 1946, 76 y.o.   MRN: [id number]  HPI   76 year old female presents to the clinic for follow-up of diabetes and high blood pressure  Patient takes metformin and Januvia without difficulty for diabetes.  Patient denies any hypoglycemic events.  Patient takes amlodipine 5 mg, valsartan and hydrochlorothiazide 160-25 mg without difficulty.  Patient states her blood pressures at home running well.  Patient states that number typically runs around 130/26 and her bottom number runs around 51s to 62s.  Patient denies any lightheadedness or dizziness chest pain, shortness of breath leg swelling, or difficulty breathing.  Patient states that she has not noticed any changes to her gumline.  Amlodipine was previously discontinued because dentist was concerned about amlodipine causing gingival hyperplasia.  Patient has a concern about bug bites.  Patient states that she went into her garden and has recently experienced a lot of bug bites and patient would like to know what she can do to relieve the itching.  Patient has no other concerns   Review of Systems  Skin:        Bug bites  All other systems reviewed and are negative.      Objective:   Physical Exam Vitals reviewed.  Constitutional:      General: She is not in acute distress.    Appearance: Normal appearance. She is obese. She is not ill-appearing, toxic-appearing or diaphoretic.  HENT:     Head: Normocephalic and atraumatic.  Cardiovascular:     Rate and Rhythm: Normal rate and regular rhythm.     Pulses: Normal pulses.     Heart sounds: Normal heart sounds. No murmur heard. Pulmonary:     Effort: Pulmonary effort is normal. No respiratory distress.     Breath sounds: Normal breath sounds. No wheezing.  Abdominal:     General: Abdomen is flat. Bowel sounds are normal.     Palpations: Abdomen is soft.  Musculoskeletal:     Cervical back: Normal range of motion  and neck supple. No rigidity or tenderness.     Comments: Grossly intact  Lymphadenopathy:     Cervical: No cervical adenopathy.  Skin:    General: Skin is warm.     Capillary Refill: Capillary refill takes less than 2 seconds.     Comments: 1 small papule noted behind patient's left ear and another one noted at patient's right temple.  Area pruritic.  Not red, no discharge, no swelling.  Neurological:     Mental Status: She is alert.     Comments: Grossly intact  Psychiatric:        Mood and Affect: Mood normal.        Behavior: Behavior normal.           Assessment & Plan:   1. Diabetes mellitus without complication (HCC) -Last A1c was 6.8.  We will recheck A1c today - Hemoglobin A1c - CMP14+EGFR -Return to clinic 3 months  2. Hypothyroidism, unspecified type - TSH + free T4  3. Primary hypertension -Blood pressure was initially 148/76 and 138/70 upon recheck.  Blood pressure goal of blood pressure less than 140/90 met -Continue taking amlodipine 5 mg -Continue taking chlorothiazide 25 mg -Continue taking valsartan 160 mg -Low suspicion that amlodipine caused gingival hyperplasia. -Attempted to try a different medication regiment for patient however was not successful.  Amlodipine seems to be doing well with patient.  I suggest that patient stay on  amlodipine as long as she is tolerating well. -Return to clinic in 3 months  4. Class 1 obesity due to excess calories with serious comorbidity and body mass index (BMI) of 30.0 to 30.9 in adult - Lipid Profile  5. Insect bite of head, unspecified part, initial encounter -May use over-the-counter topical Benadryl for insect bite relief. -Return to clinic if symptoms do not improve or worsen -Use over-the-counter insect repellent when going outside    Note:  This document was prepared using Dragon voice recognition software and may include unintentional dictation errors. Note - This record has been created using [employer].  Chart creation errors have been sought, but may not always  have been located. Such creation errors do not reflect on  the standard of medical care.

## 2022-06-07 LAB — CMP14+EGFR
ALT: 7 IU/L (ref 0–32)
AST: 14 IU/L (ref 0–40)
Albumin/Globulin Ratio: 1.9 (ref 1.2–2.2)
Albumin: 4.7 g/dL (ref 3.7–4.7)
Alkaline Phosphatase: 99 IU/L (ref 44–121)
BUN/Creatinine Ratio: 15 (ref 12–28)
BUN: 10 mg/dL (ref 8–27)
Bilirubin Total: 0.5 mg/dL (ref 0.0–1.2)
CO2: 26 mmol/L (ref 20–29)
Calcium: 9 mg/dL (ref 8.7–10.3)
Chloride: 102 mmol/L (ref 96–106)
Creatinine, Ser: 0.67 mg/dL (ref 0.57–1.00)
Globulin, Total: 2.5 g/dL (ref 1.5–4.5)
Glucose: 114 mg/dL — ABNORMAL HIGH (ref 70–99)
Potassium: 3.9 mmol/L (ref 3.5–5.2)
Sodium: 142 mmol/L (ref 134–144)
Total Protein: 7.2 g/dL (ref 6.0–8.5)
eGFR: 90 mL/min/{1.73_m2} (ref >59)

## 2022-06-07 LAB — LIPID PANEL
Chol/HDL Ratio: 2.5 ratio (ref 0.0–4.4)
Cholesterol, Total: 119 mg/dL (ref 100–199)
HDL: 48 mg/dL (ref >39)
LDL Chol Calc (NIH): 51 mg/dL (ref 0–99)
Triglycerides: 107 mg/dL (ref 0–149)
VLDL Cholesterol Cal: 20 mg/dL (ref 5–40)

## 2022-06-07 LAB — TSH+FREE T4
Free T4: 1.41 ng/dL (ref 0.82–1.77)
TSH: 0.404 u[IU]/mL — ABNORMAL LOW (ref 0.450–4.500)

## 2022-06-07 LAB — HEMOGLOBIN A1C
Est. average glucose Bld gHb Est-mCnc: 148 mg/dL
Hgb A1c MFr Bld: 6.8 % — ABNORMAL HIGH (ref 4.8–5.6)

## 2022-06-17 ENCOUNTER — Encounter: Payer: Self-pay | Admitting: Nurse Practitioner

## 2022-06-17 ENCOUNTER — Ambulatory Visit (INDEPENDENT_AMBULATORY_CARE_PROVIDER_SITE_OTHER): Payer: Medicare Other | Admitting: Nurse Practitioner

## 2022-06-17 VITALS — BP 130/78 | Temp 97.3°F | Ht 62.0 in | Wt 169.8 lb

## 2022-06-17 DIAGNOSIS — H02846 Edema of left eye, unspecified eyelid: Secondary | ICD-10-CM

## 2022-06-17 DIAGNOSIS — I10 Essential (primary) hypertension: Secondary | ICD-10-CM

## 2022-06-17 NOTE — Progress Notes (Signed)
   Subjective:    Patient ID: Valerie Thomas, female    DOB: 11-Sep-1946, 76 y.o.   MRN: [id number]  HPI  Patient was stung by wasp on left eye Friday. Pt says eye is still swollen.  Patient has been using over-the-counter Benadryl and using ice packs to the area in which she states that the swelling has gotten better.  Patient denies that she was stung on the actual globe of her eye and just states that her upper eyelid and lower eyelid are little swollen.  Patient denies any changes to vision or pain to the eye.  Review of Systems  Skin:        Eyelid swelling  All other systems reviewed and are negative.      Objective:   Physical Exam Vitals reviewed.  Constitutional:      General: She is not in acute distress.    Appearance: Normal appearance. She is obese. She is not ill-appearing, toxic-appearing or diaphoretic.  HENT:     Head: Normocephalic and atraumatic.  Eyes:     General: Lids are normal. Lids are everted, no foreign bodies appreciated. Vision grossly intact. Gaze aligned appropriately.        Right eye: No foreign body, discharge or hordeolum.        Left eye: No foreign body, discharge or hordeolum.     Extraocular Movements: Extraocular movements intact.     Conjunctiva/sclera: Conjunctivae normal.     Comments: Very mild swelling noted to upper eyelid.  Cardiovascular:     Rate and Rhythm: Normal rate and regular rhythm.     Pulses: Normal pulses.     Heart sounds: Normal heart sounds. No murmur heard. Pulmonary:     Effort: Pulmonary effort is normal. No respiratory distress.     Breath sounds: Normal breath sounds. No wheezing.  Musculoskeletal:     Comments: Grossly intact  Skin:    General: Skin is warm.     Capillary Refill: Capillary refill takes less than 2 seconds.  Neurological:     Mental Status: She is alert.     Comments: Grossly intact  Psychiatric:        Mood and Affect: Mood normal.        Behavior: Behavior normal.            Assessment & Plan:   1. Eyelid gland swelling, left -Continue using Benadryl over-the-counter to help with swelling and comfort -May use ice packs to help with swelling as well -Return to clinic if symptoms do not continue to improve  2. Primary hypertension -Blood pressure 130/78 today.  Blood pressure within goal of 140/90. -Continue taking blood pressure medication as prescribed -Return to clinic in August for follow-up    Note:  This document was prepared using Dragon voice recognition software and may include unintentional dictation errors. Note - This record has been created using [employer].  Chart creation errors have been sought, but may not always  have been located. Such creation errors do not reflect on  the standard of medical care.

## 2022-06-20 ENCOUNTER — Telehealth: Payer: Self-pay | Admitting: Pharmacist

## 2022-06-20 NOTE — Progress Notes (Signed)
[facility])                                            [place] Team    06/20/2022  Valerie Thomas 1946-03-09 [id number]  Received a return call from patient. HIPAA identifiers were verified. Patient is interested in medication assistance program, requests a call back at 2 PM today to allow time for her to gather house hold income statement.   [name], PharmD [place] Pharmacist Office: [phone]

## 2022-06-20 NOTE — Progress Notes (Signed)
[place] [facility])  [place] Team    06/20/2022  Valerie Thomas September 12, 1946 [id number]  Reason for referral: Medication Assistance with Januvia  Referral source:  Mariane Baumgarten, NP Current insurance: [facility]  Outreach:  Unsuccessful telephone call with Valerie Thomas.    No Known Allergies   Plan: HIPAA compliant voice mail left for patient to return my call.  Will follow-up in 1-3 business days.

## 2022-06-20 NOTE — Progress Notes (Signed)
[facility])  [place] Team    06/20/2022  Valerie Thomas 06/24/1946 [id number]  Reason for referral: Medication Assistance with Januvia   Referral source:  Mariane Baumgarten, NP  Current insurance: [facility]  Outreach:  Successful telephone call with [name].  HIPAA identifiers verified.   Objective: Lab Results  Component Value Date   CREATININE 0.67 06/06/2022   CREATININE 0.70 03/27/2022   CREATININE 0.75 02/06/2022    Lab Results  Component Value Date   HGBA1C 6.8 (H) 06/06/2022    Lipid Panel     Component Value Date/Time   CHOL 119 06/06/2022 1057   TRIG 107 06/06/2022 1057   HDL 48 06/06/2022 1057   CHOLHDL 2.5 06/06/2022 1057   LDLCALC 51 06/06/2022 1057    BP Readings from Last 3 Encounters:  06/17/22 130/78  06/06/22 138/70  04/30/22 133/83    No Known Allergies    Medication Assistance Findings:  Medication assistance needs identified: [place] with [name] via telephone, patient has 13 tablets of Januiva 100 mg on hand. The application process can take 30 to 45 days for approval plus shipping times. I will reach out to the office to see if samples are available in the interim, otherwise patient will have to call [facility] mail order for $131 for a 100 day supply.   Extra Help:  Not eligible for Extra Help Low Income Subsidy based on reported income and assets  Patient Assistance Programs: Januvia  made by [employer] requirement met: Yes Out-of-pocket prescription expenditure met:   Not Applicable Patient has met application requirements to apply for this program.    Additional medication assistance options reviewed with patient as warranted:  Insurance OTC catalogue  Plan: I will route patient assistance letter to [place] technician who will coordinate patient assistance program application process for medications listed above.  [facility] pharmacy technician will assist with obtaining all  required documents from both patient and provider(s) and submit application(s) once completed.  Will contact PCP office  regarding samples of Januvia 100 mg.  Will route note to Mariane Baumgarten, NP.  Will follow-up in 1-3 business days.  [name], PharmD [place] Pharmacist Office: [phone]

## 2022-06-21 ENCOUNTER — Other Ambulatory Visit: Payer: Self-pay

## 2022-06-21 ENCOUNTER — Ambulatory Visit: Payer: Medicare Other | Admitting: Allergy & Immunology

## 2022-06-21 ENCOUNTER — Encounter: Payer: Self-pay | Admitting: Allergy & Immunology

## 2022-06-21 VITALS — BP 124/72 | HR 93 | Temp 98.3°F | Resp 17 | Ht 59.5 in | Wt 168.0 lb

## 2022-06-21 DIAGNOSIS — J4530 Mild persistent asthma, uncomplicated: Secondary | ICD-10-CM | POA: Diagnosis not present

## 2022-06-21 DIAGNOSIS — J301 Allergic rhinitis due to pollen: Secondary | ICD-10-CM

## 2022-06-21 DIAGNOSIS — L508 Other urticaria: Secondary | ICD-10-CM | POA: Diagnosis not present

## 2022-06-21 MED ORDER — LORATADINE 10 MG PO TABS: 10.0000 mg | ORAL_TABLET | Freq: Every morning | ORAL | 5 refills | Status: AC

## 2022-06-21 MED ORDER — MONTELUKAST SODIUM 10 MG PO TABS: 10.0000 mg | ORAL_TABLET | Freq: Every day | ORAL | 5 refills | Status: DC

## 2022-06-21 MED ORDER — CETIRIZINE HCL 10 MG PO TABS
10.0000 mg | ORAL_TABLET | Freq: Every evening | ORAL | 5 refills | Status: DC
Start: 2022-06-21 — End: 2022-07-04

## 2022-06-21 NOTE — Progress Notes (Signed)
[facility])                                            [place] Team    06/21/2022  Valerie Thomas January 13, 1946 [id number]  Placed telephone call back to [name] today to relay that I called [facility], however was informed that the practice does not keep samples.  Informed [name] it was best to order a second 100 day supply through [facility] soon so that she does not risk being without her medications as it can take some time for the mail order to process the medications and ship.  [name] verbalized understanding and will refill her Januvia while we wait for the patient assistance approval.   [name], PharmD [place] Pharmacist Office: [phone]

## 2022-06-21 NOTE — Progress Notes (Unsigned)
NEW PATIENT  Date of Service/Encounter:  06/21/22  Consult requested by: AmeduiteTrenton Gammon, NP   Assessment:   Mild persistent asthma, uncomplicated  Chronic urticaria - getting labs today  Seasonal allergic rhinitis due to pollen (trees)  Plan/Recommendations:    1. Mild persistent asthma, uncomplicated - Lung testing was subpar, but this was likely just technique. - We are not going to make any changes at this time. - Daily controller medication(s): Singulair 41m daily - Prior to physical activity: albuterol 2 puffs 10-15 minutes before physical activity. - Rescue medications: albuterol 4 puffs every 4-6 hours as needed - Asthma control goals:  * Full participation in all desired activities (may need albuterol before activity) * Albuterol use two time or less a week on average (not counting use with activity) * Cough interfering with sleep two time or less a month * Oral steroids no more than once a year * No hospitalizations  2. Chronic urticaria - Your history does not have any "red flags" such as fevers, joint pains, or permanent skin changes that would be concerning for a more serious cause of hives.  - Testing was only positive to tree pollen which would not explain your hives (since tree pollen is only high from February through April/May). - Testing to the most common foods was negative, which rules out > 95% of all food allergies.  - We will get some labs to rule out serious causes of hives: complete blood count, tryptase level, chronic urticaria panel, ESR, and CRP. - We will call you in 1-2 weeks with the results of the labs.  - Chronic hives are often times a self limited process and will "burn themselves out" over 6-12 months, although this is not always the case.  - In the meantime, start suppressive dosing of antihistamines:   - Morning: Claritin (loratadine) 159m  - Evening: Zyrtec (cetirizine) 1052m Singulair (montelukast) 70m74m You can change this  dosing at home, decreasing the dose as needed or increasing the dosing as needed.  - If you are not tolerating the medications or are tired of taking them every day, we can start treatment with a monthly injectable medication called Xolair.   3. Seasonal allergic rhinitis due to pollen - Testing today showed: trees (ash pollen) - Copy of test results provided.  - Avoidance measures provided. - Continue with: Singulair (montelukast) 70mg75mly - Start taking: antihistamine as above for the hives - You can use an extra dose of the antihistamine, if needed, for breakthrough symptoms.  - Consider nasal saline rinses 1-2 times daily to remove allergens from the nasal cavities as well as help with mucous clearance (this is especially helpful to do before the nasal sprays are given) - Consider allergy shots as a means of long-term control. - Allergy shots "re-train" and "reset" the immune system to ignore environmental allergens and decrease the resulting immune response to those allergens (sneezing, itchy watery eyes, runny nose, nasal congestion, etc).    - Allergy shots improve symptoms in 75-85% of patients.  - We can discuss more at the next appointment if the medications are not working for you.  4. Return in about 8 weeks (around 08/16/2022).    This note in its entirety was forwarded to the Provider who requested this consultation.  Subjective:   [name] 76 y.9 female presenting today for evaluation of  Chief Complaint  Patient presents with   Urticaria    [name]  Valerie Thomas has a history of the following: Patient Active Problem List   Diagnosis Date Noted   Urticaria 04/30/2022   Obesity 02/13/2022   Gastroesophageal reflux disease 02/13/2022   Colon cancer screening 02/13/2022   Change in bowel habit 02/13/2022   Diabetes mellitus without complication (Valerie Thomas) 15/83/0940   Hypertension 02/13/2022   Vertigo 02/06/2022    History obtained from: chart review and  patient.  Valerie Thomas was referred by Ameduite, Trenton Gammon, NP.     Valerie Thomas is a 76 y.o. female presenting for an evaluation of urticaria .  She broke out in hives in June when she was gardening. She is unsure of the trigger. She was outside and working in the garden. They were on her arms initially and over her entire body. She did have some coughing, but no throat swelling. She called the PCP and she was seen the same day. She saw Dr. Lacinda Axon in May 2023. She was placed on a ten day taper of prednisone and H1/H2 blockers. It still took weeks for them to clear up. The resolution of the symptoms. It seems that she saw the NP one week before and was placed on triamcinolone as needed. The NP reported that it was dermatitis rather than urticaria. [name] reports that they were very raised and itchy.   Hives lasted in total around 4-6 weeks.  She has had hives in the past years ago. She was in the garden at that time as well. But the details are hazy. She was told that she was allergic to some kind of plant.   Of note, she was seen by Dr. Ishmael Holter in November 2016. She was positive to multiple indoor and outdoor allergens. She had food testing that was slightly reactive to beef, otherwise negative.  She was told to continue with Claritin daily as well as nasal saline rinses.  She was also continued on Ventolin with Flovent 110 mcg 2 puffs once daily as a preventative.  She was on Symbicort at that time, so she was stepdown to the [place].   Asthma/Respiratory Symptom History: She is currently on montelukast. She is also on albuterol as needed. She has not used her albuterol in weeks. But she does take the montelukast every night.   Allergic Rhinitis Symptom History: She does have some postnasal drip as well as rhinorrhea and sneezing.  She will get occasional itchy watery eyes. She does not get sinus infections "any more often than anyone else".  She does not seem to have infections aside from some inflammation.   I have never seen ENT.  Otherwise, there is no history of other atopic diseases, including food allergies, drug allergies, stinging insect allergies, eczema, or contact dermatitis. There is no significant infectious history. Vaccinations are up to date.    Past Medical History: Patient Active Problem List   Diagnosis Date Noted   Urticaria 04/30/2022   Obesity 02/13/2022   Gastroesophageal reflux disease 02/13/2022   Colon cancer screening 02/13/2022   Change in bowel habit 02/13/2022   Diabetes mellitus without complication ([place]) 76/80/8811   Hypertension 02/13/2022   Vertigo 02/06/2022    Medication List:  Allergies as of 06/21/2022   No Known Allergies      Medication List        Accurate as of June 21, 2022  1:28 PM. If you have any questions, ask your nurse or doctor.          Accu-Chek Aviva Plus test strip Generic drug:  glucose blood   Accu-Chek Aviva Plus w/Device Kit   Accu-Chek Softclix Lancets lancets   acetaminophen 500 MG tablet Commonly known as: TYLENOL Take 1-2 tablets (500-1,000 mg total) by mouth every 6 (six) hours as needed.   albuterol 108 (90 Base) MCG/ACT inhaler Commonly known as: VENTOLIN HFA USE 2 INHALATIONS BY MOUTH EVERY 6 HOURS AS NEEDED FOR WHEEZING  OR SHORTNESS OF BREATH   amLODipine 2.5 MG tablet Commonly known as: NORVASC Take 2 tablets (5 mg total) by mouth daily.   amLODipine 10 MG tablet Commonly known as: NORVASC Take 10 mg by mouth daily.   aspirin EC 81 MG tablet Take 81 mg by mouth daily.   cetirizine 10 MG tablet Commonly known as: ZYRTEC Take 1 tablet (10 mg total) by mouth daily.   Cod Liver Oil Caps Take 1 capsule by mouth daily.   EPINEPHrine 0.3 mg/0.3 mL Soaj injection Commonly known as: EpiPen 2-Pak Inject 0.3 mLs (0.3 mg total) into the muscle once.   famotidine 20 MG tablet Commonly known as: Pepcid Take 1 tablet (20 mg total) by mouth 2 (two) times daily.   gabapentin 300 MG  capsule Commonly known as: NEURONTIN Take 300 mg by mouth 2 (two) times daily.   Januvia 100 MG tablet Generic drug: sitaGLIPtin Take 1 tablet (100 mg total) by mouth daily.   levothyroxine 100 MCG tablet Commonly known as: SYNTHROID Take 100 mcg by mouth daily before breakfast.   metFORMIN 500 MG tablet Commonly known as: GLUCOPHAGE TAKE 1 TABLET BY MOUTH TWICE  DAILY WITH A MEAL   montelukast 10 MG tablet Commonly known as: SINGULAIR Take 10 mg by mouth daily.   potassium chloride SA 20 MEQ tablet Commonly known as: KLOR-CON M Take 20 mEq by mouth 2 (two) times daily.   predniSONE 10 MG tablet Commonly known as: DELTASONE 50 mg daily x 2 days, then 40 mg daily x 2 days, then 30 mg daily x 2 days, then 20 mg daily x 2 days, then 10 mg daily x 2 days.   simvastatin 40 MG tablet Commonly known as: ZOCOR Take 40 mg by mouth daily.   sulindac 200 MG tablet Commonly known as: CLINORIL Take 200 mg by mouth 2 (two) times daily.   SYSTANE OP Apply 1 drop to eye daily as needed (dry eyes).   valsartan-hydrochlorothiazide 160-25 MG tablet Commonly known as: DIOVAN-HCT TAKE 1 TABLET BY MOUTH DAILY        Birth History: non-contributory  Developmental History: non-contributory  Past Surgical History: Past Surgical History:  Procedure Laterality Date   ABDOMINAL HYSTERECTOMY     HAND SURGERY     THYROID SURGERY     torn ligament     neck surgery    UTERINE FIBROID SURGERY       Family History: Family History  Problem Relation Age of Onset   Asthma Sister    Allergic rhinitis Neg Hx    Urticaria Neg Hx    Eczema Neg Hx      Social History: Yasuko lives at home in a house that is 77 years old.  There is tile in the main living areas and hardwood in the bedrooms.  She has electric heating and central cooling.  There is a cat inside the house including the bedroom.  There are no roaches in the home.  There are no dust mite covers on the bedding.  There is no  tobacco exposure.  She is currently retired.  She is  there is no fume, chemical, or dust exposure.  She does not use a HEPA filter in her home.  She does not live near an interstate or industrial area.   Review of Systems  Constitutional: Negative.  Negative for chills, fever, malaise/fatigue and weight loss.  HENT:  Positive for congestion. Negative for ear discharge and ear pain.   Eyes:  Negative for pain, discharge and redness.  Respiratory:  Negative for cough, sputum production, shortness of breath and wheezing.   Cardiovascular: Negative.  Negative for chest pain and palpitations.  Gastrointestinal:  Negative for abdominal pain, constipation, diarrhea, heartburn, nausea and vomiting.  Skin:  Positive for itching and rash.  Neurological:  Negative for dizziness and headaches.  Endo/Heme/Allergies:  Negative for environmental allergies. Does not bruise/bleed easily.       Objective:   Blood pressure 124/72, pulse 93, temperature 98.3 F (36.8 C), temperature source Temporal, resp. rate 17, height 4' 11.5" (1.511 m), weight 168 lb (76.2 kg), SpO2 97 %. Body mass index is 33.36 kg/m.     Physical Exam Constitutional:      Appearance: She is well-developed.  HENT:     Head: Normocephalic and atraumatic.     Right Ear: Tympanic membrane, ear canal and external ear normal. No drainage, swelling or tenderness. Tympanic membrane is not injected, scarred, erythematous, retracted or bulging.     Left Ear: Tympanic membrane, ear canal and external ear normal. No drainage, swelling or tenderness. Tympanic membrane is not injected, scarred, erythematous, retracted or bulging.     Nose: No nasal deformity, septal deviation, mucosal edema or rhinorrhea.     Right Sinus: No maxillary sinus tenderness or frontal sinus tenderness.     Left Sinus: No maxillary sinus tenderness or frontal sinus tenderness.     Mouth/Throat:     Mouth: Mucous membranes are not pale and not dry.     Pharynx:  Uvula midline.  Eyes:     General:        Right eye: No discharge.        Left eye: No discharge.     Conjunctiva/sclera: Conjunctivae normal.     Right eye: Right conjunctiva is not injected. No chemosis.    Left eye: Left conjunctiva is not injected. No chemosis.    Pupils: Pupils are equal, round, and reactive to light.  Cardiovascular:     Rate and Rhythm: Normal rate and regular rhythm.     Heart sounds: Normal heart sounds.  Pulmonary:     Effort: Pulmonary effort is normal. No tachypnea, accessory muscle usage or respiratory distress.     Breath sounds: Normal breath sounds. No wheezing, rhonchi or rales.  Chest:     Chest wall: No tenderness.  Abdominal:     Tenderness: There is no abdominal tenderness. There is no guarding or rebound.  Lymphadenopathy:     Head:     Right side of head: No submandibular, tonsillar or occipital adenopathy.     Left side of head: No submandibular, tonsillar or occipital adenopathy.     Cervical: No cervical adenopathy.  Skin:    General: Skin is warm.     Capillary Refill: Capillary refill takes less than 2 seconds.     Coloration: Skin is not pale.     Findings: No abrasion, erythema, petechiae or rash. Rash is not papular, urticarial or vesicular.     Comments: Some excoriations present. No urticaria appreciated today.   Neurological:     Mental Status:  She is alert.      Diagnostic studies:    Spirometry: Her FVC was 65%.  Her FEV percentage did not show up because her expiration lasted only 1 second.  Overall, technique was not great.  Allergy Studies:     Airborne Adult Perc - 06/21/22 0949     Time Antigen Placed 0949    Allergen Manufacturer Lavella Hammock    Location Back    Number of Test 59    Panel 1 Select    1. Control-Buffer 50% Glycerol Negative    2. Control-Histamine 1 mg/ml 3+    3. Albumin saline Negative    4. Henry Negative    5. [place] Negative    6. Johnson Negative    7. Harlan Blue Negative    8. Meadow  Fescue Negative    9. Perennial Rye Negative    10. Sweet Vernal Negative    11. Timothy Negative    12. Cocklebur Negative    13. Burweed Marshelder Negative    14. Ragweed, short Negative    15. Ragweed, Giant Negative    16. Plantain,  English Negative    17. Lamb's Quarters Negative    18. Sheep Sorrell Negative    19. Rough Pigweed Negative    20. Marsh Elder, Rough Negative    21. Mugwort, Common Negative    22. Ash mix 3+    23. Birch mix Negative    24. Beech American Negative    25. Box, Elder Negative    26. Cedar, red Negative    27. Cottonwood, [place] Negative    28. Elm mix Negative    29. Hickory Negative    30. Maple mix Negative    31. Oak, [place] mix Negative    32. Pecan Pollen Negative    33. Pine mix Negative    34. Sycamore Eastern Negative    35. Max Meadows, Black Pollen Negative    36. Alternaria alternata Negative    37. Cladosporium Herbarum Negative    38. Aspergillus mix Negative    39. Penicillium mix Negative    40. Bipolaris sorokiniana (Helminthosporium) Negative    41. Drechslera spicifera (Curvularia) Negative    42. Mucor plumbeus Negative    43. Fusarium moniliforme Negative    44. Aureobasidium pullulans (pullulara) Negative    45. Rhizopus oryzae Negative    46. Botrytis cinera Negative    47. Epicoccum nigrum Negative    48. Phoma betae Negative    49. Candida Albicans Negative    50. Trichophyton mentagrophytes Negative    51. Mite, D Farinae  5,000 AU/ml Negative    52. Mite, D Pteronyssinus  5,000 AU/ml Negative    53. Cat Hair 10,000 BAU/ml Negative    54.  Dog Epithelia Negative    55. Mixed Feathers Negative    56. Horse Epithelia Negative    57. Cockroach, German Negative    58. Mouse Negative    59. Tobacco Leaf Negative             Food Perc - 06/21/22 0949       Test Information   Time Antigen Placed 3295    Allergen Manufacturer Lavella Hammock    Location Back    Number of allergen test [place]   1. Peanut Negative    2. Soybean food Negative    3. Wheat, whole Negative    4. Sesame Negative  5. Milk, cow Negative    6. Egg White, chicken Negative    7. Casein Negative    8. Shellfish mix Negative    9. Fish mix Negative    10. Cashew Negative             Allergy testing results were read and interpreted by myself, documented by clinical staff.         Salvatore Marvel, MD [facility] [place]

## 2022-06-21 NOTE — Patient Instructions (Addendum)
1. Mild persistent asthma, uncomplicated - Lung testing was subpar, but this was likely just technique. - We are not going to make any changes at this time. - Daily controller medication(s): Singulair 8m daily - Prior to physical activity: albuterol 2 puffs 10-15 minutes before physical activity. - Rescue medications: albuterol 4 puffs every 4-6 hours as needed - Asthma control goals:  * Full participation in all desired activities (may need albuterol before activity) * Albuterol use two time or less a week on average (not counting use with activity) * Cough interfering with sleep two time or less a month * Oral steroids no more than once a year * No hospitalizations  2. Chronic urticaria - Your history does not have any "red flags" such as fevers, joint pains, or permanent skin changes that would be concerning for a more serious cause of hives.  - Testing was only positive to tree pollen which would not explain your hives (since tree pollen is only high from February through April/May). - Testing to the most common foods was negative, which rules out > 95% of all food allergies.  - We will get some labs to rule out serious causes of hives: complete blood count, tryptase level, chronic urticaria panel, ESR, and CRP. - We will call you in 1-2 weeks with the results of the labs.  - Chronic hives are often times a self limited process and will "burn themselves out" over 6-12 months, although this is not always the case.  - In the meantime, start suppressive dosing of antihistamines:   - Morning: Claritin (loratadine) 19m  - Evening: Zyrtec (cetirizine) 1027m Singulair (montelukast) 23m33m You can change this dosing at home, decreasing the dose as needed or increasing the dosing as needed.  - If you are not tolerating the medications or are tired of taking them every day, we can start treatment with a monthly injectable medication called Xolair.   3. Seasonal allergic rhinitis due to  pollen - Testing today showed: trees (ash pollen) - Copy of test results provided.  - Avoidance measures provided. - Continue with: Singulair (montelukast) 23mg77mly - Start taking: antihistamine as above for the hives - You can use an extra dose of the antihistamine, if needed, for breakthrough symptoms.  - Consider nasal saline rinses 1-2 times daily to remove allergens from the nasal cavities as well as help with mucous clearance (this is especially helpful to do before the nasal sprays are given) - Consider allergy shots as a means of long-term control. - Allergy shots "re-train" and "reset" the immune system to ignore environmental allergens and decrease the resulting immune response to those allergens (sneezing, itchy watery eyes, runny nose, nasal congestion, etc).    - Allergy shots improve symptoms in 75-85% of patients.  - We can discuss more at the next appointment if the medications are not working for you.     4. Return in about 8 weeks (around 08/16/2022).    Please inform us of[place] Emergency Department visits, hospitalizations, or changes in symptoms. Call us be[place] going to the ED for breathing or allergy symptoms since we might be able to fit you in for a sick visit. Feel free to contact us an[place]ime with any questions, problems, or concerns.  It was a pleasure to meet you today!  Websites that have reliable patient information: 1. American Academy of Asthma, Allergy, and Immunology: www.aaaai.org 2. Food Allergy Research and Education (FARE): foodallergy.org 3. Mothers of Asthmatics: http://www.asthmacommunitynetwork.org  4. American College of Allergy, Asthma, and Immunology: www.acaai.org   COVID-19 Vaccine Information can be found at: [email or web address] For questions related to vaccine distribution or appointments, please email [email or web address] or call [phone].   We realize that you might be  concerned about having an allergic reaction to the COVID19 vaccines. To help with that concern, WE ARE OFFERING THE COVID19 VACCINES IN OUR OFFICE! Ask the front desk for dates!     "Like" [place] on Facebook and Instagram for our latest updates!      A healthy democracy works best when [employer] participate! Make sure you are registered to vote! If you have moved or changed any of your contact information, you will need to get this updated before voting!  In some cases, you MAY be able to register to vote online: [email or web address]       Airborne Adult Perc - 06/21/22 0949     Time Antigen Placed 0949    Allergen Manufacturer Lavella Hammock    Location Back    Number of Test 59    Panel 1 Select    1. Control-Buffer 50% Glycerol Negative    2. Control-Histamine 1 mg/ml 3+    3. Albumin saline Negative    4. [place] Negative    5. [place] Negative    6. Johnson Negative    7. Ginyard Deer Blue Negative    8. Meadow Fescue Negative    9. Perennial Rye Negative    10. Sweet Vernal Negative    11. Timothy Negative    12. Cocklebur Negative    13. Burweed Marshelder Negative    14. Ragweed, short Negative    15. Ragweed, Giant Negative    16. Plantain,  English Negative    17. Lamb's Quarters Negative    18. Sheep Sorrell Negative    19. Rough Pigweed Negative    20. Marsh Elder, Rough Negative    21. Mugwort, Common Negative    22. Ash mix 3+    23. Birch mix Negative    24. Beech American Negative    25. Box, Elder Negative    26. Cedar, red Negative    27. Cottonwood, [place] Negative    28. Elm mix Negative    29. Hickory Negative    30. Maple mix Negative    31. Oak, [place] mix Negative    32. Pecan Pollen Negative    33. Pine mix Negative    34. Sycamore Eastern Negative    35. Craig, Black Pollen Negative    36. Alternaria alternata Negative    37. Cladosporium Herbarum Negative    38. Aspergillus mix Negative    39. Penicillium mix  Negative    40. Bipolaris sorokiniana (Helminthosporium) Negative    41. Drechslera spicifera (Curvularia) Negative    42. Mucor plumbeus Negative    43. Fusarium moniliforme Negative    44. Aureobasidium pullulans (pullulara) Negative    45. Rhizopus oryzae Negative    46. Botrytis cinera Negative    47. Epicoccum nigrum Negative    48. Phoma betae Negative    49. Candida Albicans Negative    50. Trichophyton mentagrophytes Negative    51. Mite, D Farinae  5,000 AU/ml Negative    52. Mite, D Pteronyssinus  5,000 AU/ml Negative    53. Cat Hair 10,000 BAU/ml Negative    54.  Dog Epithelia Negative    55. Mixed Feathers Negative    56. Horse Epithelia Negative    57. Cockroach, [place] Negative  58. Mouse Negative    59. Tobacco Leaf Negative             Food Perc - 06/21/22 0949       Test Information   Time Antigen Placed 4835    Allergen Manufacturer Lavella Hammock    Location Back    Number of allergen test Mackey   1. Peanut Negative    2. Soybean food Negative    3. Wheat, whole Negative    4. Sesame Negative    5. Milk, cow Negative    6. Egg White, chicken Negative    7. Casein Negative    8. Shellfish mix Negative    9. Fish mix Negative    10. Cashew Negative            Reducing Pollen Exposure  The American Academy of Allergy, Asthma and Immunology suggests the following steps to reduce your exposure to pollen during allergy seasons.    Do not hang sheets or clothing out to dry; pollen may collect on these items. Do not mow lawns or spend time around freshly cut grass; mowing stirs up pollen. Keep windows closed at night.  Keep car windows closed while driving. Minimize morning activities outdoors, a time when pollen counts are usually at their highest. Stay indoors as much as possible when pollen counts or humidity is high and on windy days when pollen tends to remain in the air longer. Use air conditioning when possible.  Many air  conditioners have filters that trap the pollen spores. Use a HEPA room air filter to remove pollen form the indoor air you breathe.

## 2022-06-23 ENCOUNTER — Encounter: Payer: Self-pay | Admitting: Allergy & Immunology

## 2022-06-24 ENCOUNTER — Other Ambulatory Visit: Payer: Self-pay

## 2022-06-24 DIAGNOSIS — E119 Type 2 diabetes mellitus without complications: Secondary | ICD-10-CM

## 2022-06-24 LAB — ALPHA-GAL PANEL
Allergen Lamb IgE: 12.2 kU/L — AB
Beef IgE: 28.6 kU/L — AB
IgE (Immunoglobulin E), Serum: 2297 IU/mL — ABNORMAL HIGH (ref 6–495)
O215-IgE Alpha-Gal: >100 kU/L — AB
Pork IgE: 5.61 kU/L — AB

## 2022-06-24 MED ORDER — EPINEPHRINE 0.3 MG/0.3ML IJ SOAJ: 0.3000 mg | Freq: Once | INTRAMUSCULAR | 1 refills | Status: AC

## 2022-06-24 MED ORDER — JANUVIA 100 MG PO TABS: 100.0000 mg | ORAL_TABLET | Freq: Every day | ORAL | 0 refills | Status: DC

## 2022-06-24 NOTE — Progress Notes (Signed)
[facility])                                            [place] Team    06/24/2022  Valerie Thomas 22-Apr-1946 [id number]  Placed telephone follow up call to [name] to let her know that the Januvia 100 mg #30 day supply is ready at [facility] and the cost is $47. Patient verbalized understanding.   [name], PharmD [place] Pharmacist Office: [phone]

## 2022-06-24 NOTE — Progress Notes (Signed)
[facility])                                            [place] Team    06/24/2022  Valerie Thomas 1946/07/05 [id number]  Received telephone call from [name] today, she reports that when she called in her next supply of Januvia, the cost was $230 for a 100 day supply. She is unable to afford this cost at this time. I informed patient that I will attempt to get a 30 day supply issued to a local pharmacy. [name] stated that she uses [facility].    Will place telephone call to [facility] and follow up with patient on cost.   [name], PharmD [place] Pharmacist Office: [phone]

## 2022-06-25 ENCOUNTER — Telehealth: Payer: Self-pay | Admitting: Pharmacy Technician

## 2022-06-25 DIAGNOSIS — Z596 Low income: Secondary | ICD-10-CM

## 2022-06-25 NOTE — Progress Notes (Signed)
[facility])                                            [place] Team    06/25/2022  Valerie Thomas 1946/11/20 [id number]                                      Medication Assistance Referral  Referral From: [place]  Medication/Company:  Celesta Gentile / [employer] Patient application portion:  Education officer, museum portion:  Mailed to Mariane Baumgarten, NP Provider address/fax verified via: [employer]. Yaminah Clayborn, [place]  [phone]

## 2022-06-26 LAB — CBC WITH DIFFERENTIAL
Basophils Absolute: 0.1 10*3/uL (ref 0.0–0.2)
Basos: 0 %
EOS (ABSOLUTE): 0.1 10*3/uL (ref 0.0–0.4)
Eos: 1 %
Hematocrit: 38.8 % (ref 34.0–46.6)
Hemoglobin: 12.3 g/dL (ref 11.1–15.9)
Immature Grans (Abs): 0 10*3/uL (ref 0.0–0.1)
Immature Granulocytes: 0 %
Lymphocytes Absolute: 0.9 10*3/uL (ref 0.7–3.1)
Lymphs: 8 %
MCH: 27.7 pg (ref 26.6–33.0)
MCHC: 31.7 g/dL (ref 31.5–35.7)
MCV: 87 fL (ref 79–97)
Monocytes Absolute: 0.8 10*3/uL (ref 0.1–0.9)
Monocytes: 7 %
Neutrophils Absolute: 9.4 10*3/uL — ABNORMAL HIGH (ref 1.4–7.0)
Neutrophils: 84 %
RBC: 4.44 x10E6/uL (ref 3.77–5.28)
RDW: 14.4 % (ref 11.7–15.4)
WBC: 11.2 10*3/uL — ABNORMAL HIGH (ref 3.4–10.8)

## 2022-06-26 LAB — TRYPTASE: Tryptase: 5.5 ug/L (ref 2.2–13.2)

## 2022-06-26 LAB — SEDIMENTATION RATE: Sed Rate: 53 mm/hr — ABNORMAL HIGH (ref 0–40)

## 2022-06-26 LAB — ANTINUCLEAR ANTIBODIES, IFA: ANA Titer 1: NEGATIVE

## 2022-06-26 LAB — THYROID ANTIBODIES
Thyroglobulin Antibody: <1 IU/mL (ref 0.0–0.9)
Thyroperoxidase Ab SerPl-aCnc: 16 IU/mL (ref 0–34)

## 2022-06-26 LAB — C-REACTIVE PROTEIN: CRP: 25 mg/L — ABNORMAL HIGH (ref 0–10)

## 2022-06-26 MED ORDER — EPINEPHRINE 0.3 MG/0.3ML IJ SOAJ: 0.3000 mg | Freq: Once | INTRAMUSCULAR | 2 refills | Status: AC

## 2022-06-26 NOTE — Addendum Note (Signed)
Addended by: Valentina Shaggy on: 06/26/2022 10:24 AM   Modules accepted: Orders

## 2022-06-26 NOTE — Progress Notes (Signed)
The patient called to ask about her EpiPen script. We are going to send her the results. She eats bacon every morning. She has purchased a month supply of bacon and does not want to give it away since she is on a fixed income.   She also reports that she was bit by a wasp on her eye lid. She did not tell me that last time. I added on a stinging insect panel. Hopefully we can get that added to existing blood.   Can someone print off her labs and send to her? Also include some information on alpha gal.   Salvatore Marvel, MD [facility] [place] [facility]

## 2022-06-29 LAB — ALLERGEN STINGING INSECT PANEL
Honeybee IgE: 1.65 kU/L — AB
Hornet, White Face, IgE: 0.75 kU/L — AB
Hornet, Yellow, IgE: 0.68 kU/L — AB
Paper Wasp IgE: 2.26 kU/L — AB
Yellow Jacket, IgE: 4.23 kU/L — AB

## 2022-06-29 LAB — SPECIMEN STATUS REPORT

## 2022-07-04 ENCOUNTER — Encounter: Payer: Self-pay | Admitting: Nurse Practitioner

## 2022-07-04 ENCOUNTER — Ambulatory Visit (INDEPENDENT_AMBULATORY_CARE_PROVIDER_SITE_OTHER): Payer: Medicare Other | Admitting: Nurse Practitioner

## 2022-07-04 VITALS — BP 130/70 | HR 97 | Ht 62.0 in | Wt 168.8 lb

## 2022-07-04 DIAGNOSIS — E119 Type 2 diabetes mellitus without complications: Secondary | ICD-10-CM | POA: Diagnosis not present

## 2022-07-04 DIAGNOSIS — I10 Essential (primary) hypertension: Secondary | ICD-10-CM | POA: Diagnosis not present

## 2022-07-04 DIAGNOSIS — L509 Urticaria, unspecified: Secondary | ICD-10-CM

## 2022-07-04 NOTE — Progress Notes (Signed)
   Subjective:    Patient ID: Valerie Thomas, female    DOB: Mar 04, 1946, 76 y.o.   MRN: [id number]  HPI  4 wk follow up for DM and HTN. Patient states that BP run around 130/80 at home. Continues to take amlodipine, valsartan, HCTZ without difficulty.  Denies any chest pain, SOB, difficulty breathing, or swelling of feet or ankles.   Last A1c WNL at 6.8. Continues to take Januvia without difficulty.   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 Patient has follow up appointment with Allergist on Sept 15th.  Patient has no other concerns.   Review of Systems  All other systems reviewed and are negative.      Objective:   Physical Exam Vitals reviewed.  Constitutional:      General: She is not in acute distress.    Appearance: Normal appearance. She is normal weight. She is not ill-appearing, toxic-appearing or diaphoretic.  HENT:     Head: Normocephalic and atraumatic.  Cardiovascular:     Rate and Rhythm: Normal rate and regular rhythm.     Pulses: Normal pulses.     Heart sounds: Normal heart sounds. No murmur heard. Pulmonary:     Effort: Pulmonary effort is normal. No respiratory distress.     Breath sounds: Normal breath sounds. No wheezing.  Musculoskeletal:     Right lower leg: No edema.     Left lower leg: No edema.     Comments: Grossly intact  Skin:    General: Skin is warm.     Capillary Refill: Capillary refill takes less than 2 seconds.  Neurological:     Mental Status: She is alert.     Comments: Grossly intact  Psychiatric:        Mood and Affect: Mood normal.        Behavior: Behavior normal.           Assessment & Plan:   1. Primary hypertension -Blood pressure today 130/70.  Blood pressure within goal of less than 140 over 90s. -Continue to take blood pressure medication as prescribed -Follow-up in 3 months  2. Diabetes mellitus without complication ([place]) -Last A1c  6.8. -Continue to take medication as prescribed -Follow-up 3 months  3. Urticaria -Continue follow-up with allergist as scheduled    Note:  This document was prepared using Dragon voice recognition software and may include unintentional dictation errors. Note - This record has been created using [employer].  Chart creation errors have been sought, but may not always  have been located. Such creation errors do not reflect on  the standard of medical care.

## 2022-07-09 ENCOUNTER — Telehealth: Payer: Self-pay | Admitting: Pharmacy Technician

## 2022-07-09 DIAGNOSIS — Z596 Low income: Secondary | ICD-10-CM

## 2022-07-09 NOTE — Progress Notes (Signed)
[facility])                                            [place] Team    07/09/2022  Valerie Thomas [birth year] [id number]  Received both patient and provider portion(s) of patient assistance application(s) for Januvia. Mailed completed application and required documents into [employer].   Tannis Burstein P. Simonne Boulos, [place]  [phone]

## 2022-07-10 ENCOUNTER — Telehealth: Payer: Self-pay

## 2022-07-11 ENCOUNTER — Other Ambulatory Visit: Payer: Self-pay

## 2022-07-11 DIAGNOSIS — J4520 Mild intermittent asthma, uncomplicated: Secondary | ICD-10-CM

## 2022-07-11 MED ORDER — SIMVASTATIN 40 MG PO TABS: 40.0000 mg | ORAL_TABLET | Freq: Every day | ORAL | 1 refills | Status: DC

## 2022-07-11 MED ORDER — POTASSIUM CHLORIDE CRYS ER 20 MEQ PO TBCR: 20.0000 meq | EXTENDED_RELEASE_TABLET | Freq: Two times a day (BID) | ORAL | 1 refills | Status: DC

## 2022-07-11 MED ORDER — LEVOTHYROXINE SODIUM 100 MCG PO TABS: 100.0000 ug | ORAL_TABLET | Freq: Every day | ORAL | 1 refills | Status: DC

## 2022-07-11 MED ORDER — ALBUTEROL SULFATE HFA 108 (90 BASE) MCG/ACT IN AERS: INHALATION_SPRAY | RESPIRATORY_TRACT | 1 refills | Status: DC

## 2022-07-11 NOTE — Telephone Encounter (Signed)
Refills sent in

## 2022-07-12 ENCOUNTER — Other Ambulatory Visit: Payer: Self-pay

## 2022-07-12 DIAGNOSIS — J4520 Mild intermittent asthma, uncomplicated: Secondary | ICD-10-CM

## 2022-07-12 MED ORDER — POTASSIUM CHLORIDE CRYS ER 20 MEQ PO TBCR: 20.0000 meq | EXTENDED_RELEASE_TABLET | Freq: Two times a day (BID) | ORAL | 1 refills | Status: DC

## 2022-07-12 MED ORDER — SIMVASTATIN 40 MG PO TABS: 40.0000 mg | ORAL_TABLET | Freq: Every day | ORAL | 1 refills | Status: DC

## 2022-07-12 MED ORDER — LEVOTHYROXINE SODIUM 100 MCG PO TABS: 100.0000 ug | ORAL_TABLET | Freq: Every day | ORAL | 1 refills | Status: DC

## 2022-07-12 MED ORDER — ALBUTEROL SULFATE HFA 108 (90 BASE) MCG/ACT IN AERS: INHALATION_SPRAY | RESPIRATORY_TRACT | 1 refills | Status: DC

## 2022-07-16 NOTE — Progress Notes (Signed)
[facility])                                            [place] Team    07/16/2022  Valerie Thomas [birth year] [id number]  Placed outreach call to patient today to check on supply of Januvia. Planned to call [place] patient assistance program to inquire about a 30 day voucher for patient while we wait for status update on assistance application.   Left HIPAA compliant VM for patient to return my call.   [name], PharmD [place] Pharmacist Office: [phone]

## 2022-08-02 ENCOUNTER — Telehealth: Payer: Self-pay | Admitting: Nurse Practitioner

## 2022-08-02 NOTE — Progress Notes (Signed)
[facility])                                            [place] Team    08/02/2022  Valerie Thomas Sep 01, 1946 [id number]  Sent attestation form for patient assistance via fax to [place], RN, for Mrs. Pulsifer to sign.

## 2022-08-02 NOTE — Telephone Encounter (Signed)
Sydney from [facility] Patient Assistance calling to let us know they are at the stage in the application process with Januvia.  where pt is to sign a form. They need to fax over form and have patient come to office and then fax back to them. Also requesting a 30 day supply of Januvia to be sent to [facility]. Jodi Mourning would like a call back once we have been in touch with patient and once we have faxed the form back.

## 2022-08-02 NOTE — Telephone Encounter (Signed)
Correction to name-[facility] Patient Assistance Valerie Thomas.

## 2022-08-02 NOTE — Telephone Encounter (Signed)
Pt came to office to sign form. Form faxed back to [facility]. Left detailed message on [place]. [phone]

## 2022-08-03 ENCOUNTER — Other Ambulatory Visit: Payer: Self-pay | Admitting: Nurse Practitioner

## 2022-08-03 DIAGNOSIS — J4520 Mild intermittent asthma, uncomplicated: Secondary | ICD-10-CM

## 2022-08-08 ENCOUNTER — Telehealth: Payer: Self-pay | Admitting: Pharmacy Technician

## 2022-08-08 DIAGNOSIS — Z596 Low income: Secondary | ICD-10-CM

## 2022-08-08 NOTE — Progress Notes (Signed)
[facility])                                            [place] Team    08/08/2022  LUARA FAYE 08/06/1946 [id number]  Care coordination call placed to [employer] in regard to [place] application.  Spoke to Sheldon who informs he would have to transfer me to a supervisor. Spoke to Dwight who informs patient is APPROVED 08/06/22-12/01/22. Medication was delivered to patient's home address on 08/07/22. Patient can call [employer] in mid-late November for her next shipment. Patient is aware and informed of this information.Patient confirmed receiving shipment.  Shayli Altemose P. Luetta Piazza, [place]  [phone]

## 2022-08-16 ENCOUNTER — Ambulatory Visit: Payer: Medicare Other | Admitting: Allergy & Immunology

## 2022-08-16 ENCOUNTER — Encounter: Payer: Self-pay | Admitting: Allergy & Immunology

## 2022-08-16 VITALS — BP 122/68 | HR 92 | Temp 98.7°F | Resp 16 | Ht 59.0 in | Wt 169.8 lb

## 2022-08-16 DIAGNOSIS — T7800XD Anaphylactic reaction due to unspecified food, subsequent encounter: Secondary | ICD-10-CM

## 2022-08-16 DIAGNOSIS — L508 Other urticaria: Secondary | ICD-10-CM | POA: Diagnosis not present

## 2022-08-16 DIAGNOSIS — J301 Allergic rhinitis due to pollen: Secondary | ICD-10-CM

## 2022-08-16 DIAGNOSIS — J4520 Mild intermittent asthma, uncomplicated: Secondary | ICD-10-CM | POA: Diagnosis not present

## 2022-08-16 NOTE — Patient Instructions (Addendum)
1. Mild persistent asthma, uncomplicated - Lung testing looked awesome.  - Daily controller medication(s): Guarding. - Prior to physical activity: albuterol 2 puffs 10-15 minutes before physical activity. - Rescue medications: albuterol 4 puffs every 4-6 hours as needed - Asthma control goals:  * Full participation in all desired activities (may need albuterol before activity) * Albuterol use two time or less a week on average (not counting use with activity) * Cough interfering with sleep two time or less a month * Oral steroids no more than once a year * No hospitalizations  2. 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 - We will recheck your levels at the next visit and see where things are trending.   - Morning: Claritin (loratadine) '10mg'$    - Evening: Singulair (montelukast) '10mg'$  - You can do twice daily if things are not well controlled.  - If you are not tolerating the medications or are tired of taking them every day, we can start treatment with a monthly injectable medication called Xolair.   3. Seasonal allergic rhinitis due to pollen - Previous testing showed: trees (ash pollen) - Continue with: Claritin (loratadine) '10mg'$  as above and Singulair (montelukast) '10mg'$  daily - You can use an extra dose of the antihistamine, if needed, for breakthrough symptoms.   4. Return in about 6 months (around 02/14/2023).    Please inform us of any Emergency Department visits, hospitalizations, or changes in symptoms. Call us before going to the ED for breathing or allergy symptoms since we might be able to fit you in for a sick visit. Feel free to contact us anytime with any questions, problems, or concerns.  It was a pleasure to see you again today!  Websites that have reliable patient information: 1. American Academy of Asthma, Allergy, and Immunology: www.aaaai.org 2. Food Allergy Research and Education (FARE): foodallergy.org 3. Mothers of Asthmatics:  http://www.asthmacommunitynetwork.org 4. American College of Allergy, Asthma, and Immunology: www.acaai.org   COVID-19 Vaccine Information can be found at: ShippingScam.co.uk For questions related to vaccine distribution or appointments, please email [email or web address] or call [phone].   We realize that you might be concerned about having an allergic reaction to the COVID19 vaccines. To help with that concern, WE ARE OFFERING THE COVID19 VACCINES IN OUR OFFICE! Ask the front desk for dates!     "Like" [place] on Facebook and Instagram for our latest updates!      A healthy democracy works best when [employer] participate! Make sure you are registered to vote! If you have moved or changed any of your contact information, you will need to get this updated before voting!  In some cases, you MAY be able to register to vote online: [email or web address]      Alpha-gal and Red Meat Allergy   Overview An allergy to "alpha-gal" refers to having a severe and potentially life-threatening allergy to a carbohydrate molecule called galactose-alpha-1,3-galactose that is found in most mammalian or "red meat". Unlike other food allergies which typically occur within minutes of ingestion, symptoms from eating red meat such as pork, lamb or beef may be delayed, occurring 3-8 hours after eating. Most food allergies are directed against a protein molecule, but alpha-gal is unusual because it is a carbohydrate, and a delay in its absorption may explain the delay in symptoms.  What are the symptoms of an alpha-gal allergy? As with other food allergies, signs or symptoms of an allergy to alpha-gal may include: Hives and itching  Swelling of your  lips, face or eyelids  Shortness of breath, cough or wheezing  Abdominal pain, nausea, diarrhea or vomiting The most severe reaction, anaphylaxis, can present as a combination  of several of these symptoms, may include low blood pressure, and is potentially fatal.  Because these symptoms are delayed, you may only wake up with them in the middle of the night after an evening meal.  How is an alpha-gal allergy diagnosed? Diagnosis of this allergy starts with your allergist taking an appropriate history and physical examination. Because the onset is usually quite delayed, it can be hard to associate the symptoms with eating red meat many hours previously. Triggers include any red meat - including beef, pork, lamb or even horse products. It may occur after eating hotdogs and hamburgers. In very rare cases the reaction may extend to milk or dairy proteins and gelatin.  Your allergist may recommend testing that includes skin tests to the relevant animal proteins and blood tests which measure the levels of a specific immunoglobulin E (IgE) antibody, to mammalian meats. An investigational blood test, IgE against alpha-gal itself, may also aid in the diagnosis.  How is an alpha-gal allergy treated? Immediate symptoms such as hives or shortness of breath are treated the same as any other food allergy - in an urgent care setting with anti-histamines, epinephrine and other medications. Prevention long-term involves avoidance of all red meat in sensitized individuals. You may be advised to carry an epinephrine auto-injector, to be used in case of subsequent accidental exposures and reaction. These measures do not necessarily mean switching to a full vegetarian diet, since poultry and fish can be consumed and do not cause similar reactions. As with other food allergies, there is the possibility that over time the sensitivity diminishes - although these changes may take many years to become apparent.  How do you become allergic to alpha-gal? Alpha-gal is a molecule carried in the saliva of the [place] tick and other potential arthropods typically after feeding on mammalian blood. People  that are bitten by the tick, especially those that are bitten repeatedly, are at risk of becoming sensitized and producing the IgE necessary to then cause allergic reactions. Interestingly, allergic reactions may occur to red meat, to subsequent tick bites, and even to medications that contain alpha-gal. Cetuximab is a cancer medication that contains alpha-gal, and people who have had allergic reactions to this medication (these are typically immediate reactions, because it is infused intravenously) have a higher risk for red meat allergy and are likely to have been bitten by ticks in the past. As might be expected, the incidence of tick bites is much higher in the [place] and [place] the traditional habitat for the tick. However, cases are now increasingly reported in the [place] and [place] states. And it is a phenomenon that has been observed worldwide, with different ticks responsible for similar cases of red meat allergy in many other countries such as [place], [place] and [place].  The discovery of this peculiar allergy has allowed researchers to correlate tick bites with many cases of anaphylaxis that would previously have been classified as 'idiopathic', or of unknown cause. Also, while it was originally thought that the [employer] tick had to feast on mammalian blood in order to carry the alpha-gal molecule, more recent research has shown that it may carry this molecule and be capable of sensitizing humans independently.  How do you prevent an alpha-gal allergy? Because this allergy is predominantly tick born, you are more likely  at risk if you often go outdoors in wooded areas for activities such as hiking, fishing or hunting. The key strategy is to prevent tick bites. This may include wearing long sleeved shirts or pants, using appropriate insect repellants, and surveying for ticks after spending time outdoors. Any observed ticks should be removed carefully by cleaning the site with  rubbing alcohol, then using tweezers to pull the tick's head up carefully from the skin using steady pressure. Clean your hands and the site one more time and make sure not to crush the tick between your fingers.

## 2022-08-16 NOTE — Progress Notes (Signed)
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 Mild persistent asthma, uncomplicated - Lung testing looked awesome.  - Daily controller medication(s): NOTHING - Prior to physical activity: albuterol 2 puffs 10-15 minutes before physical activity. - Rescue medications: albuterol 4 puffs every 4-6 hours as needed - Asthma control goals:  * Full participation in all desired activities (may need albuterol before activity) * Albuterol use two time or less a week on average (not counting use with activity) * Cough interfering with sleep two time or less a month * Oral steroids no more than once a year * No hospitalizations  2. 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 - We will recheck your levels at the next visit and see where things are trending.   - Morning: Claritin (loratadine) '10mg'$    - Evening: Singulair (montelukast) '10mg'$  - You can do twice daily if things are not well controlled.  - If you are not tolerating the medications or are tired of taking them every day, we can start treatment with a monthly injectable medication called Xolair.   3. Seasonal allergic rhinitis due to pollen - Previous testing showed: trees (ash pollen) - Continue with: Claritin (loratadine) '10mg'$  as above and Singulair (montelukast) '10mg'$  daily - You can use an extra dose of the antihistamine, if needed, for breakthrough symptoms.   4. Return in about 6 months (around 02/14/2023).    Subjective:   Valerie Thomas is a 76 y.o. female presenting today for follow up of  Chief Complaint  Patient presents with   Asthma    No issues    Eczema    No issues    Other    2 big bumps on her forehead. Not itching and has not needed cream. Not sure the cause.    alpha  gal    Patient has some concerns on what to avoid     Valerie Thomas has a history of the following: Patient Active Problem List   Diagnosis Date Noted   Urticaria 04/30/2022   Obesity 02/13/2022   Gastroesophageal reflux disease 02/13/2022   Colon cancer screening 02/13/2022   Change in bowel habit 02/13/2022   Diabetes mellitus without complication ([place]) 42/59/5638   Hypertension 02/13/2022   Vertigo 02/06/2022    History obtained from: chart review and patient.  Valerie Thomas is a 76 y.o. female presenting for a follow up visit.  She was last seen in July 2023 as a new patient.  At that time, her lung testing looked that this was likely secondary to technique.  We continue with Singulair 10 mg daily as well as albuterol as needed.  For her chronic urticaria, we obtained lab work to rule out serious causes.  Testing was only positive to tree pollen at the visit.  Restarted Claritin in the morning and Zyrtec and Singulair at night.  We did talk about allergy shots for long-term control, although we would only be treating the tree pollen which might not be helpful.   Her labs are notable for very elevated alpha gal panel IgE levels.  Her inflammatory markers were elevated, but her ANA was negative.  Her complete blood count, tryptase, and antithyroid antibodies were all normal.  Since the last visit, she has done  well. She ended up giving her bacon to her grand son. She has some knots on her face that are itching.  She eats mostly chicken now. She does eat some tilapia at [place].  She is really tired of chicken and fish.  She misses her bacon.  She always went to [place] when she came to [facility] and ordered a ham biscuit.  I reassured her that the chicken biscuits were very good as well, but that did not seem to make her feel any better.  She does report that she has some breakouts when she is outdoor working in the garden.  These breakouts mostly happen during the summer.  She really  does not understand why she never has them in the winter if this is all related to alpha-gal.  It is not as if she eats less red meat during the winter months.   Her rhinitis is well controlled. She has some runny nose but it is not severe.   Otherwise, there have been no changes to her past medical history, surgical history, family history, or social history.    Review of Systems  Constitutional: Negative.  Negative for chills, fever, malaise/fatigue and weight loss.  HENT: Negative.  Negative for congestion, ear discharge and ear pain.   Eyes:  Negative for pain, discharge and redness.  Respiratory:  Negative for cough, sputum production, shortness of breath and wheezing.   Cardiovascular: Negative.  Negative for chest pain and palpitations.  Gastrointestinal:  Negative for abdominal pain, constipation, diarrhea, heartburn, nausea and vomiting.  Skin: Negative.  Negative for itching and rash.  Neurological:  Negative for dizziness and headaches.  Endo/Heme/Allergies:  Negative for environmental allergies. Does not bruise/bleed easily.       Objective:   Blood pressure 122/68, pulse 92, temperature 98.7 F (37.1 C), resp. rate 16, height '4\' 11"'$  (1.499 m), weight 169 lb 12.8 oz (77 kg), SpO2 96 %. Body mass index is 34.3 kg/m.    Physical Exam Vitals reviewed.  Constitutional:      Appearance: She is well-developed.  HENT:     Head: Normocephalic and atraumatic.     Right Ear: Tympanic membrane, ear canal and external ear normal. No drainage, swelling or tenderness. Tympanic membrane is not injected, scarred, erythematous, retracted or bulging.     Left Ear: Tympanic membrane, ear canal and external ear normal. No drainage, swelling or tenderness. Tympanic membrane is not injected, scarred, erythematous, retracted or bulging.     Nose: No nasal deformity, septal deviation, mucosal edema or rhinorrhea.     Right Turbinates: Enlarged, swollen and pale.     Left Turbinates:  Enlarged, swollen and pale.     Right Sinus: No maxillary sinus tenderness or frontal sinus tenderness.     Left Sinus: No maxillary sinus tenderness or frontal sinus tenderness.     Comments: Runny nose.    Mouth/Throat:     Mouth: Mucous membranes are not pale and not dry.     Pharynx: Uvula midline.  Eyes:     General:        Right eye: No discharge.        Left eye: No discharge.     Conjunctiva/sclera: Conjunctivae normal.     Right eye: Right conjunctiva is not injected. No chemosis.    Left eye: Left conjunctiva is not injected. No chemosis.    Pupils: Pupils are equal, round, and reactive to light.  Cardiovascular:     Rate and Rhythm: Normal rate and  regular rhythm.     Heart sounds: Normal heart sounds.  Pulmonary:     Effort: Pulmonary effort is normal. No tachypnea, accessory muscle usage or respiratory distress.     Breath sounds: Normal breath sounds. No wheezing, rhonchi or rales.  Chest:     Chest wall: No tenderness.  Abdominal:     Tenderness: There is no abdominal tenderness. There is no guarding or rebound.  Lymphadenopathy:     Head:     Right side of head: No submandibular, tonsillar or occipital adenopathy.     Left side of head: No submandibular, tonsillar or occipital adenopathy.     Cervical: No cervical adenopathy.  Skin:    General: Skin is warm.     Capillary Refill: Capillary refill takes less than 2 seconds.     Coloration: Skin is not pale.     Findings: No abrasion, erythema, petechiae or rash. Rash is not papular, urticarial or vesicular.     Comments: Some excoriations present. No urticaria appreciated today.   Neurological:     Mental Status: She is alert.  Psychiatric:        Behavior: Behavior is cooperative.      Diagnostic studies:   FEV1 1.34 L (92%).  FVC 1.26 L (73%).  FEV1 to FVC ratio 99.  Spirometry is normal with no scooping suggestive of obstructive disease.     Salvatore Marvel, MD  [facility]

## 2022-08-16 NOTE — Addendum Note (Signed)
Addended by: Chip Boer R on: 08/16/2022 01:16 PM   Modules accepted: Orders

## 2022-09-05 ENCOUNTER — Other Ambulatory Visit: Payer: Self-pay | Admitting: Nurse Practitioner

## 2022-09-05 ENCOUNTER — Ambulatory Visit: Payer: Medicare Other | Admitting: Nurse Practitioner

## 2022-10-04 ENCOUNTER — Telehealth: Payer: Self-pay | Admitting: Pharmacist

## 2022-10-04 NOTE — Progress Notes (Signed)
[place] [facility])                                            [place] Team    10/04/2022  Valerie Thomas June 11, 1946 [id number]  Placed telephonic outreach to Valerie Thomas to discuss 2024 re-enrollment with Januvia. Unable to leave a voice mail as mailbox memory is full. Will attempt to reach again in 1 to 3 business days.   [name], PharmD [place] Pharmacist Office: [phone]

## 2022-10-07 ENCOUNTER — Ambulatory Visit (INDEPENDENT_AMBULATORY_CARE_PROVIDER_SITE_OTHER): Payer: Medicare Other | Admitting: Nurse Practitioner

## 2022-10-07 ENCOUNTER — Encounter: Payer: Self-pay | Admitting: Nurse Practitioner

## 2022-10-07 VITALS — BP 106/65 | Ht 59.0 in | Wt 170.4 lb

## 2022-10-07 DIAGNOSIS — I10 Essential (primary) hypertension: Secondary | ICD-10-CM

## 2022-10-07 DIAGNOSIS — E785 Hyperlipidemia, unspecified: Secondary | ICD-10-CM | POA: Diagnosis not present

## 2022-10-07 DIAGNOSIS — E1169 Type 2 diabetes mellitus with other specified complication: Secondary | ICD-10-CM

## 2022-10-07 DIAGNOSIS — E039 Hypothyroidism, unspecified: Secondary | ICD-10-CM

## 2022-10-07 DIAGNOSIS — E119 Type 2 diabetes mellitus without complications: Secondary | ICD-10-CM

## 2022-10-07 MED ORDER — AMLODIPINE BESYLATE 5 MG PO TABS: 5.0000 mg | ORAL_TABLET | Freq: Every day | ORAL | 1 refills | Status: DC

## 2022-10-07 MED ORDER — AMLODIPINE BESYLATE 5 MG PO TABS: 5.0000 mg | ORAL_TABLET | Freq: Every day | ORAL | 0 refills | Status: DC

## 2022-10-07 NOTE — Progress Notes (Signed)
Subjective:    Patient ID: Valerie Thomas, female    DOB: [birth year], 76 y.o.   MRN: [id number]  Hypertension This is a chronic problem. The current episode started more than 1 year ago. Risk factors for coronary artery disease include diabetes mellitus, post-menopausal state and sedentary lifestyle. Treatments tried: valsartan/hctz, norvasc.   Patient states that she does not take blood pressure at home. Patient denies any light headedness or dizziness. Patient states that she is taking Amlodipine 60m, Valsartan 1618m and HCTZ 251m  Diabetes Patient continues to take Januvia 200m39mily and Metformin 500mg21mce daily. Patient denies any hypoglycemic events. Patient states that she does not take blood sugars at home.  Hypolipidemia Last TSH was mildly low at 0.404. Patient was instructed at that time to take her current pills (levothyroxine 100mcg8mce a day for 6 days out of the week. Patient denies any issues with her medication at this time.   Patient denies any chest pain, sob, or difficulty breathing.   Review of Systems  All other systems reviewed and are negative.      Objective:   Physical Exam Vitals reviewed.  Constitutional:      General: She is not in acute distress.    Appearance: Normal appearance. She is obese. She is not ill-appearing, toxic-appearing or diaphoretic.  HENT:     Head: Normocephalic and atraumatic.  Cardiovascular:     Rate and Rhythm: Normal rate and regular rhythm.     Pulses: Normal pulses.     Heart sounds: Normal heart sounds. No murmur heard. Pulmonary:     Effort: Pulmonary effort is normal. No respiratory distress.     Breath sounds: Normal breath sounds. No wheezing.  Musculoskeletal:     Comments: Grossly intact  Skin:    General: Skin is warm.     Capillary Refill: Capillary refill takes less than 2 seconds.  Neurological:     Mental Status: She is alert.     Comments: Grossly intact  Psychiatric:        Mood and Affect:  Mood normal.        Behavior: Behavior normal.           Assessment & Plan:    1. Primary hypertension -Blood pressure to 106/65.  Although at goal of less than 140/90 I am concerned blood pressure is too low per age.  Patient denies any lightheadedness or dizziness. -Patient to decrease her amlodipine from 10 mg to 5 mg -15 tablet supply of amlodipine 5 mg sent to local [facility] with larger supply sent to mail order as patient requested - amLODipine (NORVASC) 5 MG tablet; Take 1 tablet (5 mg total) by mouth daily.  Dispense: 15 tablet; Refill: 0 - CMP14+EGFR - amLODipine (NORVASC) 5 MG tablet; Take 1 tablet (5 mg total) by mouth daily.  Dispense: 90 tablet; Refill: 1 -Return to clinic in 2 weeks for blood pressure check  2. Hypothyroidism, unspecified type -Last TSH was mildly low at 0.404 -Patient was instructed to take levothyroxine 100 mcg daily 6 times a week. -We will recheck TSH + free T4  3. Diabetes mellitus without complication (HCC) -[place] A1c 6.8 -Patient to continue taking Januvia 100 mg daily and metformin 500 mg twice daily. -We will recheck Hemoglobin A1c  4. Hyperlipidemia associated with type 2 diabetes mellitus (HCC) -Last LDL well controlled at 51 -We will recheck Lipid Profile   Follow-up in 2 weeks    Note:  This document was prepared  using Systems analyst and may include unintentional dictation errors. Note - This record has been created using [employer].  Chart creation errors have been sought, but may not always  have been located. Such creation errors do not reflect on  the standard of medical care.

## 2022-10-08 LAB — CMP14+EGFR
ALT: 9 IU/L (ref 0–32)
AST: 18 IU/L (ref 0–40)
Albumin/Globulin Ratio: 1.8 (ref 1.2–2.2)
Albumin: 4.6 g/dL (ref 3.8–4.8)
Alkaline Phosphatase: 115 IU/L (ref 44–121)
BUN/Creatinine Ratio: 15 (ref 12–28)
BUN: 10 mg/dL (ref 8–27)
Bilirubin Total: 0.4 mg/dL (ref 0.0–1.2)
CO2: 25 mmol/L (ref 20–29)
Calcium: 9.6 mg/dL (ref 8.7–10.3)
Chloride: 103 mmol/L (ref 96–106)
Creatinine, Ser: 0.65 mg/dL (ref 0.57–1.00)
Globulin, Total: 2.5 g/dL (ref 1.5–4.5)
Glucose: 110 mg/dL — ABNORMAL HIGH (ref 70–99)
Potassium: 3.9 mmol/L (ref 3.5–5.2)
Sodium: 143 mmol/L (ref 134–144)
Total Protein: 7.1 g/dL (ref 6.0–8.5)
eGFR: 91 mL/min/{1.73_m2} (ref >59)

## 2022-10-08 LAB — LIPID PANEL
Chol/HDL Ratio: 2 ratio (ref 0.0–4.4)
Cholesterol, Total: 101 mg/dL (ref 100–199)
HDL: 51 mg/dL (ref >39)
LDL Chol Calc (NIH): 36 mg/dL (ref 0–99)
Triglycerides: 64 mg/dL (ref 0–149)
VLDL Cholesterol Cal: 14 mg/dL (ref 5–40)

## 2022-10-08 LAB — HEMOGLOBIN A1C
Est. average glucose Bld gHb Est-mCnc: 140 mg/dL
Hgb A1c MFr Bld: 6.5 % — ABNORMAL HIGH (ref 4.8–5.6)

## 2022-10-08 LAB — TSH+FREE T4
Free T4: 1.47 ng/dL (ref 0.82–1.77)
TSH: 0.074 u[IU]/mL — ABNORMAL LOW (ref 0.450–4.500)

## 2022-10-14 ENCOUNTER — Telehealth: Payer: Self-pay | Admitting: Pharmacist

## 2022-10-14 NOTE — Progress Notes (Signed)
[place] [facility]) [place]   10/14/2022  Valerie Thomas 06-09-1946 [id number]   Reason for referral: Medication Assistance  Referral source:  2024 Re-Enrollment  Current insurance: [facility]  Reason for call: Medication assistance re-enrollment with Januvia  Outreach:  Unsuccessful telephone call attempt #2 to patient.   Unable to leave message  Plan:  -I will make another outreach attempt to patient in 1 to 3 business days.    [name], PharmD [place] Pharmacist Office: [phone]

## 2022-10-16 ENCOUNTER — Other Ambulatory Visit: Payer: Self-pay | Admitting: Nurse Practitioner

## 2022-10-16 ENCOUNTER — Telehealth: Payer: Self-pay | Admitting: Pharmacist

## 2022-10-16 NOTE — Progress Notes (Signed)
[place] [facility]) [place]   10/16/2022  RAVINA MILNER 04-07-1946 [id number]   Reason for referral: Medication Assistance  Referral source:  2024 Re-Enrollment  Current insurance: [facility]  Reason for call: 2024 Re-Enrollment for Januvia   Outreach:  Unsuccessful telephone call attempt #3  to patient.   HIPAA compliant voicemail left requesting a return call  Plan:  -I will make another outreach attempt to patient in 1 to 3 business days.   [name], PharmD [place] Pharmacist Office: [phone]

## 2022-10-18 ENCOUNTER — Telehealth: Payer: Self-pay | Admitting: Pharmacist

## 2022-10-18 NOTE — Progress Notes (Signed)
[place] [facility])  [place] Team    10/18/2022  Valerie Thomas January 08, 1946 [id number]  Reason for referral: Medication Assistance  Referral source:  2024 Re-Enrollment  Current insurance: [facility]  Outreach:  Successful telephone call with MS. Rowe Pavy.  HIPAA identifiers verified.   Medication Assistance Findings:  Medication assistance needs identified: Januvia  Extra Help:  Not eligible for Extra Help Low Income Subsidy based on reported income and assets  Patient Assistance Programs: Januvia made by [employer] requirement met: Yes Out-of-pocket prescription expenditure met:   Not Applicable Patient has met application requirements to apply for this program.    Plan: I will route patient assistance letter to [place] technician who will coordinate patient assistance program application process for medications listed above.  [facility] pharmacy technician will assist with obtaining all required documents from both patient and provider(s) and submit application(s) once completed.   [name], PharmD [place] Pharmacist Office: [phone]

## 2022-10-21 ENCOUNTER — Ambulatory Visit (INDEPENDENT_AMBULATORY_CARE_PROVIDER_SITE_OTHER): Payer: Medicare Other | Admitting: Family Medicine

## 2022-10-21 VITALS — BP 126/82 | HR 86 | Temp 98.5°F | Ht 59.0 in | Wt 169.0 lb

## 2022-10-21 DIAGNOSIS — I10 Essential (primary) hypertension: Secondary | ICD-10-CM

## 2022-10-21 DIAGNOSIS — E119 Type 2 diabetes mellitus without complications: Secondary | ICD-10-CM

## 2022-10-21 DIAGNOSIS — E039 Hypothyroidism, unspecified: Secondary | ICD-10-CM

## 2022-10-21 DIAGNOSIS — E785 Hyperlipidemia, unspecified: Secondary | ICD-10-CM | POA: Diagnosis not present

## 2022-10-21 MED ORDER — LEVOTHYROXINE SODIUM 88 MCG PO TABS: 88.0000 ug | ORAL_TABLET | Freq: Every day | ORAL | 0 refills | Status: DC

## 2022-10-21 NOTE — Patient Instructions (Signed)
New rx for thyroid medication sent (88 mcg).  A1C was 6.5.  Follow up in 3 months.  Take care  Dr. Lacinda Axon

## 2022-10-22 DIAGNOSIS — E039 Hypothyroidism, unspecified: Secondary | ICD-10-CM | POA: Insufficient documentation

## 2022-10-22 DIAGNOSIS — E785 Hyperlipidemia, unspecified: Secondary | ICD-10-CM | POA: Insufficient documentation

## 2022-10-22 DIAGNOSIS — Z872 Personal history of diseases of the skin and subcutaneous tissue: Secondary | ICD-10-CM | POA: Insufficient documentation

## 2022-10-22 NOTE — Assessment & Plan Note (Signed)
A1c at goal.  Continue metformin and Januvia.

## 2022-10-22 NOTE — Assessment & Plan Note (Signed)
Well-controlled.  Continue current medications.  No current orthostasis.

## 2022-10-22 NOTE — Assessment & Plan Note (Signed)
Lipids have been very well-controlled.  Continue simvastatin.

## 2022-10-22 NOTE — Assessment & Plan Note (Signed)
Most recent TSH suppressed.  Decreasing levothyroxine to 88 mcg daily.

## 2022-10-22 NOTE — Progress Notes (Signed)
Subjective:  Patient ID: Valerie Thomas, female    DOB: 1946-04-23  Age: 76 y.o. MRN: [id number]  CC: Chief Complaint  Patient presents with   Follow-up    HPI:  76 year old female with hypertension, GERD, type 2 diabetes, obesity presents for follow-up.  Patient has had some recent hypotension.  Blood pressure currently stable on amlodipine 5 mg daily and valsartan/HCTZ 160-25.  Diabetes is well-controlled.  Most recent A1c 6.5.  No reports of hypoglycemia.  Doing well on metformin and Januvia.  Patient's thyroid medication recently decreased although patient is unaware and this is not on her medication list.  I need to remedy this today.  Her TSH was suppressed and she needs a decreased dose of levothyroxine.  Patient Active Problem List   Diagnosis Date Noted   History of urticaria 10/22/2022   Hyperlipidemia 10/22/2022   Hypothyroidism 10/22/2022   Obesity 02/13/2022   Gastroesophageal reflux disease 02/13/2022   Diabetes mellitus without complication ([place]) 81/85/6314   Hypertension 02/13/2022   Vertigo 02/06/2022    Social Hx   Social History   Socioeconomic History   Marital status: Widowed    Spouse name: Not on file   Number of children: Not on file   Years of education: Not on file   Highest education level: Not on file  Occupational History   Not on file  Tobacco Use   Smoking status: Never    Passive exposure: Past   Smokeless tobacco: Never  Substance and Sexual Activity   Alcohol use: No   Drug use: No   Sexual activity: Never  Other Topics Concern   Not on file  Social History Narrative   Not on file   Social Determinants of Health   Financial Resource Strain: High Risk (05/28/2022)   Overall Financial Resource Strain (CARDIA)    Difficulty of Paying Living Expenses: Hard  Food Insecurity: No Food Insecurity (05/28/2022)   Hunger Vital Sign    Worried About Running Out of Food in the Last Year: Never true    Ran Out of Food in the Last Year:  Never true  Transportation Needs: No Transportation Needs (05/28/2022)   PRAPARE - Hydrologist (Medical): No    Lack of Transportation (Non-Medical): No  Physical Activity: Insufficiently Active (05/28/2022)   Exercise Vital Sign    Days of Exercise per Week: 3 days    Minutes of Exercise per Session: 30 min  Stress: No Stress Concern Present (05/28/2022)   [place]    Feeling of Stress : Not at all  Social Connections: [place] (05/28/2022)   Social Connection and Isolation Panel [NHANES]    Frequency of Communication with Friends and Family: More than three times a week    Frequency of Social Gatherings with Friends and Family: More than three times a week    Attends Religious Services: More than 4 times per year    Active Member of [employer] or Organizations: Yes    Attends Music therapist: More than 4 times per year    Marital Status: Married    Review of Systems  Constitutional: Negative.   Respiratory: Negative.    Cardiovascular: Negative.    Objective:  BP 126/82   Pulse 86   Temp 98.5 F (36.9 C)   Ht '4\' 11"'$  (1.499 m)   Wt 169 lb (76.7 kg)   SpO2 98%   BMI 34.13 kg/m  10/21/2022    2:16 PM 10/07/2022   10:44 AM 08/16/2022   11:04 AM  BP/Weight  Systolic BP 676 195 093  Diastolic BP 82 65 68  Wt. (Lbs) 169 170.4 169.8  BMI 34.13 kg/m2 34.42 kg/m2 34.3 kg/m2    Physical Exam Vitals and nursing note reviewed.  Constitutional:      General: She is not in acute distress.    Appearance: Normal appearance. She is obese.  HENT:     Head: Normocephalic and atraumatic.  Eyes:     General:        Right eye: No discharge.        Left eye: No discharge.     Conjunctiva/sclera: Conjunctivae normal.  Cardiovascular:     Rate and Rhythm: Normal rate and regular rhythm.  Pulmonary:     Effort: Pulmonary effort is normal.     Breath sounds:  Normal breath sounds. No wheezing, rhonchi or rales.  Neurological:     Mental Status: She is alert.  Psychiatric:     Comments: Flat affect.   Diabetic Foot Check -  Appearance - no lesions, ulcers or calluses Skin - no unusual pallor or redness Monofilament testing -  Right - Great toe, medial, central, lateral ball and posterior foot intact Left - Great toe, medial, central, lateral ball and posterior foot intact  Lab Results  Component Value Date   WBC 11.2 (H) 06/21/2022   HGB 12.3 06/21/2022   HCT 38.8 06/21/2022   PLT 179 02/06/2022   GLUCOSE 110 (H) 10/07/2022   CHOL 101 10/07/2022   TRIG 64 10/07/2022   HDL 51 10/07/2022   LDLCALC 36 10/07/2022   ALT 9 10/07/2022   AST 18 10/07/2022   NA 143 10/07/2022   K 3.9 10/07/2022   CL 103 10/07/2022   CREATININE 0.65 10/07/2022   BUN 10 10/07/2022   CO2 25 10/07/2022   TSH 0.074 (L) 10/07/2022   HGBA1C 6.5 (H) 10/07/2022     Assessment & Plan:   Problem List Items Addressed This Visit       Cardiovascular and Mediastinum   Hypertension - Primary    Well-controlled.  Continue current medications.  No current orthostasis.        Endocrine   Diabetes mellitus without complication ([place])    O6Z at goal.  Continue metformin and Januvia.      Hypothyroidism    Most recent TSH suppressed.  Decreasing levothyroxine to 88 mcg daily.      Relevant Medications   levothyroxine (SYNTHROID) 88 MCG tablet     Other   Hyperlipidemia    Lipids have been very well-controlled.  Continue simvastatin.       Meds ordered this encounter  Medications   DISCONTD: levothyroxine (SYNTHROID) 88 MCG tablet    Sig: Take 1 tablet (88 mcg total) by mouth daily.    Dispense:  90 tablet    Refill:  0   levothyroxine (SYNTHROID) 88 MCG tablet    Sig: Take 1 tablet (88 mcg total) by mouth daily.    Dispense:  90 tablet    Refill:  0    Follow-up: 3 months  Hawthorn Woods DO [place]

## 2022-10-31 ENCOUNTER — Telehealth: Payer: Self-pay | Admitting: Pharmacy Technician

## 2022-10-31 DIAGNOSIS — Z596 Low income: Secondary | ICD-10-CM

## 2022-10-31 NOTE — Progress Notes (Addendum)
[name] [facility])                                            [place] Team    10/31/2022  Valerie Thomas 10-12-1946 [id number]                                      Medication Assistance Referral-FOR 2024 RE ENROLLMENT  Referral From: [place]  Medication/Company: Celesta Gentile / [employer] Patient application portion:  Education officer, museum portion:  Mailed to Dr Thersa Salt Provider address/fax verified via: Office website  Othniel Maret P. Tyon Cerasoli, [place]  [phone]

## 2022-11-06 ENCOUNTER — Telehealth: Payer: Self-pay | Admitting: Family Medicine

## 2022-11-06 MED ORDER — LEVOTHYROXINE SODIUM 88 MCG PO TABS: 88.0000 ug | ORAL_TABLET | Freq: Every day | ORAL | 0 refills | Status: DC

## 2022-11-06 NOTE — Telephone Encounter (Signed)
Patient is requesting her levothyroxine 88 mg be sent to [facility] due to mailorder hasn't came in yet and she is needing  it .

## 2022-11-06 NOTE — Telephone Encounter (Signed)
Prescription sent electronically to pharmacy. Patient notified. 

## 2022-11-08 ENCOUNTER — Telehealth: Payer: Self-pay | Admitting: Pharmacy Technician

## 2022-11-08 DIAGNOSIS — Z596 Low income: Secondary | ICD-10-CM

## 2022-11-08 NOTE — Progress Notes (Signed)
[place] [facility])                                            [place] Team    11/08/2022  Valerie Thomas 1946-08-10 [id number]  Received both patient and provider portion(s) of patient assistance application(s) for Januvia. Mailed completed application and required documents into [employer].    Magdalyn Arenivas P. Shawndra Clute, [place]  [phone]

## 2022-11-11 ENCOUNTER — Telehealth: Payer: Self-pay | Admitting: Family Medicine

## 2022-11-11 NOTE — Telephone Encounter (Signed)
Cancel recent notes patient came back and picked form up stating it had already been done.

## 2022-11-11 NOTE — Telephone Encounter (Signed)
Patient dropped off some [employer] assiatance program forms to be completed in Engineer, civil (consulting).

## 2022-11-12 ENCOUNTER — Other Ambulatory Visit: Payer: Self-pay | Admitting: *Deleted

## 2022-11-12 MED ORDER — ACCU-CHEK SOFTCLIX LANCETS MISC: 1 refills | Status: DC

## 2022-11-12 MED ORDER — ACCU-CHEK AVIVA PLUS VI STRP: ORAL_STRIP | 1 refills | Status: DC

## 2022-12-19 LAB — HM DIABETES EYE EXAM

## 2022-12-20 ENCOUNTER — Telehealth: Payer: Self-pay | Admitting: Pharmacy Technician

## 2022-12-20 DIAGNOSIS — Z596 Low income: Secondary | ICD-10-CM

## 2022-12-20 NOTE — Progress Notes (Signed)
[place] [facility])                                            [place] Team    12/20/2022  Valerie Thomas Apr 10, 1946 [id number]  Care coordination call placed to [employer] in regard to [facility] application.  Spoke to [place] who informs the application was received on 12/09/22 and attestation form mailed to patient on that date. She informs the application was also mailed to the patient because the DOB needs to be corrected as the patient part has a different birth year than the MD part and they need to match. She informs patient will need to fix the DOB issue and complete and sign attestation form and mail it all back to [employer] with the envelope they enclosed.  Valerie Thomas, [place]  [phone]

## 2022-12-23 ENCOUNTER — Telehealth: Payer: Self-pay | Admitting: Pharmacist

## 2022-12-23 NOTE — Progress Notes (Signed)
[place] [facility]) [place]   12/23/2022  Valerie Thomas 19-Aug-1946 [id number]  Reason for referral: Medication Assistance with Januvia reenrolment for 2024  Referral source:  Susy Frizzle, CPhT  Current insurance: [facility]  Reason for call: Calling to inquire if patient received the attestation form from [facility] and if so, did she mail it back in.   Outreach:  Unsuccessful telephone call attempt #1  to patient.   Unable to leave message voicemail box was full.   Plan:  -I will make another outreach attempt to patient within 3-4 business days.  Thank you for allowing [facility] pharmacy to be a part of this patient's care.    [name], PharmD [place] Clinical Pharmacist Direct Dial: [phone]

## 2022-12-27 ENCOUNTER — Telehealth: Payer: Self-pay | Admitting: Pharmacist

## 2022-12-27 NOTE — Progress Notes (Signed)
[facility] [facility]) [place]   12/27/2022  Valerie Thomas 01/18/1946 [id number]  Reason for referral: Medication Assistance with Januvia follow up   Referral source:  [place]  Current insurance: [facility]  Reason for call: Calling to inquire if patient received the attestation form from [facility] and if so, did she mail it back in.    Outreach:  Unsuccessful telephone call attempt #2 to patient.   Unable to leave message, VM is full.   Plan:  -I will make another outreach attempt to patient within 3-4 business days.  Thank you for allowing [facility] pharmacy to be a part of this patient's care.   [name], PharmD [place] Clinical Pharmacist Direct Dial: [phone]

## 2023-01-01 ENCOUNTER — Other Ambulatory Visit: Payer: Self-pay | Admitting: Family Medicine

## 2023-01-01 ENCOUNTER — Encounter: Payer: Self-pay | Admitting: *Deleted

## 2023-01-03 ENCOUNTER — Encounter: Payer: Self-pay | Admitting: Pharmacist

## 2023-01-03 NOTE — Progress Notes (Signed)
[place] [facility])  [place] pharmacy referral is being closed due to the following reasons:  Patient has called back and all information needed has been conveyed to patient for 2024 re-enrollment.  [place] is happy to assist the patient/family in the future for clinical pharmacy needs, following a discussion from your team about [place] outreach. Thank you for allowing [facility] to be a part of your patient's care.    [name], PharmD [place] Clinical Pharmacist Direct Dial: [phone]

## 2023-01-12 ENCOUNTER — Other Ambulatory Visit: Payer: Self-pay | Admitting: Allergy & Immunology

## 2023-01-21 ENCOUNTER — Ambulatory Visit: Payer: Medicare Other | Admitting: Family Medicine

## 2023-01-22 ENCOUNTER — Ambulatory Visit (INDEPENDENT_AMBULATORY_CARE_PROVIDER_SITE_OTHER): Payer: Medicare Other | Admitting: Family Medicine

## 2023-01-22 DIAGNOSIS — M25561 Pain in right knee: Secondary | ICD-10-CM

## 2023-01-22 DIAGNOSIS — I10 Essential (primary) hypertension: Secondary | ICD-10-CM

## 2023-01-22 DIAGNOSIS — M25562 Pain in left knee: Secondary | ICD-10-CM | POA: Diagnosis not present

## 2023-01-22 DIAGNOSIS — E785 Hyperlipidemia, unspecified: Secondary | ICD-10-CM

## 2023-01-22 DIAGNOSIS — E119 Type 2 diabetes mellitus without complications: Secondary | ICD-10-CM

## 2023-01-22 DIAGNOSIS — E039 Hypothyroidism, unspecified: Secondary | ICD-10-CM | POA: Diagnosis not present

## 2023-01-22 DIAGNOSIS — G8929 Other chronic pain: Secondary | ICD-10-CM

## 2023-01-22 MED ORDER — TRAMADOL HCL 50 MG PO TABS: 50.0000 mg | ORAL_TABLET | Freq: Two times a day (BID) | ORAL | 3 refills | Status: DC | PRN

## 2023-01-22 NOTE — Patient Instructions (Signed)
Labs today.  Stop Sulindac. Tramadol as needed.  Follow up in 6 months.

## 2023-01-23 DIAGNOSIS — M25569 Pain in unspecified knee: Secondary | ICD-10-CM | POA: Insufficient documentation

## 2023-01-23 LAB — TSH: TSH: 0.177 u[IU]/mL — ABNORMAL LOW (ref 0.450–4.500)

## 2023-01-23 LAB — MICROALBUMIN / CREATININE URINE RATIO
Creatinine, Urine: 26.1 mg/dL
Microalb/Creat Ratio: 14 mg/g creat (ref 0–29)
Microalbumin, Urine: 3.7 ug/mL

## 2023-01-23 MED ORDER — LEVOTHYROXINE SODIUM 75 MCG PO TABS: 75.0000 ug | ORAL_TABLET | Freq: Every day | ORAL | 3 refills | Status: DC

## 2023-01-23 NOTE — Assessment & Plan Note (Signed)
Discontinuing sulindac.  Tramadol as needed.

## 2023-01-23 NOTE — Progress Notes (Signed)
Subjective:  Patient ID: Valerie Thomas, female    DOB: May 30, 1946  Age: 77 y.o. MRN: [id number]  CC: Chief Complaint  Patient presents with   Hypertension    3 month follow up    Diabetes    Is there generic januavia its expensive   Dizziness    Since beginning of January , ear discomf., hx of vertigo, new glasses and RX in [place]    HPI:  77 year old female with the below mentioned medical problems presents for follow-up.  Patient reports intermittent dizziness.  This has been an ongoing issue for the patient.  She has had prior issues with orthostasis.  Currently she is not having any dizziness.  Patient's hypertension is currently stable and well-controlled on valsartan/HCTZ and amlodipine.  Type 2 diabetes is well-controlled on metformin and Januvia.  Patient concerned about cost of Januvia.  Patient's previous TSH was suppressed and dose of Synthroid was changed.  This needs to be reassessed today.  Patient is also on chronic NSAID due to knee pain predominantly.  She states that she has tried to stop previously and was unable to do so due to the pain.  Will discuss alternatives today.  Long-term NSAIDs pose potential harms especially at her age.   Patient Active Problem List   Diagnosis Date Noted   Knee pain 01/23/2023   History of urticaria 10/22/2022   Hyperlipidemia associated with type 2 diabetes mellitus ([place]) 10/22/2022   Hypothyroidism 10/22/2022   Obesity 02/13/2022   Gastroesophageal reflux disease 02/13/2022   Diabetes mellitus without complication ([place]) 123456   Hypertension 02/13/2022   Vertigo 02/06/2022    Social Hx   Social History   Socioeconomic History   Marital status: Widowed    Spouse name: Not on file   Number of children: Not on file   Years of education: Not on file   Highest education level: Not on file  Occupational History   Not on file  Tobacco Use   Smoking status: Never    Passive exposure: Past   Smokeless tobacco: Never   Substance and Sexual Activity   Alcohol use: No   Drug use: No   Sexual activity: Never  Other Topics Concern   Not on file  Social History Narrative   Not on file   Social Determinants of Health   Financial Resource Strain: High Risk (05/28/2022)   Overall Financial Resource Strain (CARDIA)    Difficulty of Paying Living Expenses: Hard  Food Insecurity: No Food Insecurity (05/28/2022)   Hunger Vital Sign    Worried About Running Out of Food in the Last Year: Never true    Ran Out of Food in the Last Year: Never true  Transportation Needs: No Transportation Needs (05/28/2022)   PRAPARE - Hydrologist (Medical): No    Lack of Transportation (Non-Medical): No  Physical Activity: Insufficiently Active (05/28/2022)   Exercise Vital Sign    Days of Exercise per Week: 3 days    Minutes of Exercise per Session: 30 min  Stress: No Stress Concern Present (05/28/2022)   [place]    Feeling of Stress : Not at all  Social Connections: [place] (05/28/2022)   Social Connection and Isolation Panel [NHANES]    Frequency of Communication with Friends and Family: More than three times a week    Frequency of Social Gatherings with Friends and Family: More than three times a week  Attends Religious Services: More than 4 times per year    Active Member of Clubs or Organizations: Yes    Attends Archivist Meetings: More than 4 times per year    Marital Status: Married    Review of Systems Per HPI  Objective:  BP 126/80   Pulse 84   Temp 97.7 F (36.5 C)   Ht 4' 11"$  (1.499 m)   Wt 172 lb (78 kg)   SpO2 98%   BMI 34.74 kg/m      01/22/2023    9:52 AM 10/21/2022    2:16 PM 10/07/2022   10:44 AM  BP/Weight  Systolic BP 123XX123 123XX123 [id number]  Diastolic BP 80 82 65  Wt. (Lbs) 172 169 170.4  BMI 34.74 kg/m2 34.13 kg/m2 34.42 kg/m2    Physical Exam Vitals and nursing note  reviewed.  Constitutional:      General: She is not in acute distress.    Appearance: Normal appearance.  HENT:     Head: Normocephalic and atraumatic.  Cardiovascular:     Rate and Rhythm: Normal rate and regular rhythm.  Pulmonary:     Effort: Pulmonary effort is normal.     Breath sounds: Normal breath sounds. No wheezing, rhonchi or rales.  Neurological:     Mental Status: She is alert.  Psychiatric:     Comments: Flat affect.     Lab Results  Component Value Date   WBC 11.2 (H) 06/21/2022   HGB 12.3 06/21/2022   HCT 38.8 06/21/2022   PLT 179 02/06/2022   GLUCOSE 110 (H) 10/07/2022   CHOL 101 10/07/2022   TRIG 64 10/07/2022   HDL 51 10/07/2022   LDLCALC 36 10/07/2022   ALT 9 10/07/2022   AST 18 10/07/2022   NA 143 10/07/2022   K 3.9 10/07/2022   CL 103 10/07/2022   CREATININE 0.65 10/07/2022   BUN 10 10/07/2022   CO2 25 10/07/2022   TSH 0.177 (L) 01/22/2023   HGBA1C 6.5 (H) 10/07/2022     Assessment & Plan:   Problem List Items Addressed This Visit       Cardiovascular and Mediastinum   Hypertension    Stable.  Continue current medications.        Endocrine   Hypothyroidism    TSH returned suppressed again.  Decreasing levothyroxine dose to 75 mcg daily.      Relevant Medications   levothyroxine (SYNTHROID) 75 MCG tablet   Other Relevant Orders   TSH (Completed)   Diabetes mellitus without complication ([place])    Well-controlled.  Continuing metformin and Januvia.  Januvia is still covered for her this year.  If we need to make changes in the future we will do so.      Relevant Orders   Microalbumin / creatinine urine ratio (Completed)     Other   Knee pain    Discontinuing sulindac.  Tramadol as needed.       Meds ordered this encounter  Medications   traMADol (ULTRAM) 50 MG tablet    Sig: Take 1 tablet (50 mg total) by mouth every 12 (twelve) hours as needed for severe pain or moderate pain.    Dispense:  30 tablet    Refill:  3    levothyroxine (SYNTHROID) 75 MCG tablet    Sig: Take 1 tablet (75 mcg total) by mouth daily.    Dispense:  90 tablet    Refill:  3    Follow-up:  Return in about 6  months (around 07/23/2023).  [place]

## 2023-01-23 NOTE — Assessment & Plan Note (Signed)
Stable.  Continue current medications.

## 2023-01-23 NOTE — Assessment & Plan Note (Signed)
Well-controlled.  Continuing metformin and Januvia.  Januvia is still covered for her this year.  If we need to make changes in the future we will do so.

## 2023-01-23 NOTE — Assessment & Plan Note (Signed)
TSH returned suppressed again.  Decreasing levothyroxine dose to 75 mcg daily.

## 2023-01-28 ENCOUNTER — Other Ambulatory Visit: Payer: Self-pay | Admitting: *Deleted

## 2023-01-28 DIAGNOSIS — I10 Essential (primary) hypertension: Secondary | ICD-10-CM

## 2023-01-28 MED ORDER — VALSARTAN-HYDROCHLOROTHIAZIDE 160-25 MG PO TABS: 1.0000 | ORAL_TABLET | Freq: Every day | ORAL | 0 refills | Status: DC

## 2023-01-28 MED ORDER — METFORMIN HCL 500 MG PO TABS: 500.0000 mg | ORAL_TABLET | Freq: Two times a day (BID) | ORAL | 0 refills | Status: DC

## 2023-01-28 MED ORDER — SIMVASTATIN 40 MG PO TABS: 40.0000 mg | ORAL_TABLET | Freq: Every day | ORAL | 0 refills | Status: DC

## 2023-01-29 ENCOUNTER — Telehealth: Payer: Self-pay | Admitting: Pharmacy Technician

## 2023-01-29 DIAGNOSIS — Z596 Low income: Secondary | ICD-10-CM

## 2023-01-29 NOTE — Progress Notes (Signed)
[facility])                                            [place] Team    01/29/2023  MARSHAYLA MEI 1946/11/24 [id number]  Care coordination call placed to [employer] in regard to [facility] application.  Spoke to Roy who informs patient is APPROVED 01/22/23-12/02/23. Patient will need to call [employer] at [phone] or [place] [employer] at [phone] to order refills when she has a supply or 14 days or less left.  Mychal Decarlo P. Eesha Schmaltz, [place]  [phone]

## 2023-01-31 MED ORDER — LEVOTHYROXINE SODIUM 75 MCG PO TABS: 75.0000 ug | ORAL_TABLET | Freq: Every day | ORAL | 1 refills | Status: DC

## 2023-01-31 NOTE — Addendum Note (Signed)
Addended by: Dairl Ponder on: 01/31/2023 09:11 AM   Modules accepted: Orders

## 2023-02-19 ENCOUNTER — Other Ambulatory Visit (HOSPITAL_COMMUNITY): Payer: Self-pay | Admitting: Family Medicine

## 2023-02-19 DIAGNOSIS — Z1231 Encounter for screening mammogram for malignant neoplasm of breast: Secondary | ICD-10-CM

## 2023-02-21 ENCOUNTER — Other Ambulatory Visit: Payer: Self-pay

## 2023-02-21 ENCOUNTER — Ambulatory Visit: Payer: Medicare Other | Admitting: Allergy & Immunology

## 2023-02-21 ENCOUNTER — Encounter: Payer: Self-pay | Admitting: Allergy & Immunology

## 2023-02-21 VITALS — BP 126/70 | HR 106 | Temp 98.2°F | Resp 18 | Ht 59.45 in | Wt 171.5 lb

## 2023-02-21 DIAGNOSIS — T7800XD Anaphylactic reaction due to unspecified food, subsequent encounter: Secondary | ICD-10-CM | POA: Diagnosis not present

## 2023-02-21 DIAGNOSIS — J4520 Mild intermittent asthma, uncomplicated: Secondary | ICD-10-CM

## 2023-02-21 DIAGNOSIS — L508 Other urticaria: Secondary | ICD-10-CM | POA: Diagnosis not present

## 2023-02-21 DIAGNOSIS — J301 Allergic rhinitis due to pollen: Secondary | ICD-10-CM

## 2023-02-21 NOTE — Patient Instructions (Addendum)
1. Mild persistent asthma, uncomplicated - Lung testing looked stellar.  - Daily controller medication(s): NOTHING - Prior to physical activity: albuterol 2 puffs 10-15 minutes before physical activity. - Rescue medications: albuterol 4 puffs every 4-6 hours as needed - Asthma control goals:  * Full participation in all desired activities (may need albuterol before activity) * Albuterol use two time or less a week on average (not counting use with activity) * Cough interfering with sleep two time or less a month * Oral steroids no more than once a year * No hospitalizations  2. Chronic urticaria - We will recheck your levels to see where the levels are trending. - We will call you in 1-2 weeks with the results of the testing.   - Morning: Claritin (loratadine) 10mg    - Evening: Singulair (montelukast) 10mg  - You can do twice daily if things are not well controlled.  - We could add Xolair to help with controlling the symptoms.   3. Seasonal allergic rhinitis due to pollen - Previous testing showed: trees (ash pollen) - Continue with: Claritin (loratadine) 10mg  as above and Singulair (montelukast) 10mg  daily - You can use an extra dose of the antihistamine, if needed, for breakthrough symptoms.   4. Return in about 6 months (around 08/24/2023).    Please inform us of any Emergency Department visits, hospitalizations, or changes in symptoms. Call us before going to the ED for breathing or allergy symptoms since we might be able to fit you in for a sick visit. Feel free to contact us anytime with any questions, problems, or concerns.  It was a pleasure to see you again today!  Websites that have reliable patient information: 1. American Academy of Asthma, Allergy, and Immunology: www.aaaai.org 2. Food Allergy Research and Education (FARE): foodallergy.org 3. Mothers of Asthmatics: http://www.asthmacommunitynetwork.org 4. American College of Allergy, Asthma, and Immunology:  www.acaai.org   COVID-19 Vaccine Information can be found at: ShippingScam.co.uk For questions related to vaccine distribution or appointments, please email [email or web address] or call [phone].   We realize that you might be concerned about having an allergic reaction to the COVID19 vaccines. To help with that concern, WE ARE OFFERING THE COVID19 VACCINES IN OUR OFFICE! Ask the front desk for dates!     "Like" [place] on Facebook and Instagram for our latest updates!      A healthy democracy works best when [employer] participate! Make sure you are registered to vote! If you have moved or changed any of your contact information, you will need to get this updated before voting!  In some cases, you MAY be able to register to vote online: [email or web address]

## 2023-02-21 NOTE — Progress Notes (Signed)
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 Mild persistent asthma, uncomplicated - Lung testing looked stellar.  - Daily controller medication(s): NOTHING - Prior to physical activity: albuterol 2 puffs 10-15 minutes before physical activity. - Rescue medications: albuterol 4 puffs every 4-6 hours as needed - Asthma control goals:  * Full participation in all desired activities (may need albuterol before activity) * Albuterol use two time or less a week on average (not counting use with activity) * Cough interfering with sleep two time or less a month * Oral steroids no more than once a year * No hospitalizations  2. Chronic urticaria - We will recheck your levels to see where the levels are trending. - We will call you in 1-2 weeks with the results of the testing.   - Morning: Claritin (loratadine) 10mg    - Evening: Singulair (montelukast) 10mg  - You can do twice daily if things are not well controlled.  - We could add Xolair to help with controlling the symptoms.   3. Seasonal allergic rhinitis due to pollen - Previous testing showed: trees (ash pollen) - Continue with: Claritin (loratadine) 10mg  as above and Singulair (montelukast) 10mg  daily - You can use an extra dose of the antihistamine, if needed, for breakthrough symptoms.   4. Return in about 6 months (around 08/24/2023).     Subjective:   Valerie Thomas is a 77 y.o. female presenting today for follow up of  Chief Complaint  Patient presents with   Follow-up    Itchy face, tried a new towel and caused her face to itch and breakout. Uses her albuterol sometimes during the night and morning     Valerie Thomas has a history of the following: Patient Active Problem List   Diagnosis Date Noted    Knee pain 01/23/2023   History of urticaria 10/22/2022   Hyperlipidemia associated with type 2 diabetes mellitus ([place]) 10/22/2022   Hypothyroidism 10/22/2022   Obesity 02/13/2022   Gastroesophageal reflux disease 02/13/2022   Diabetes mellitus without complication ([place]) 123456   Hypertension 02/13/2022   Vertigo 02/06/2022    History obtained from: chart review and patient.  Valerie Thomas is a 77 y.o. female presenting for a follow up visit.  She was last seen and September 2023.  At that time, her lung testing looked amazing.  We continue with albuterol as needed.  For her urticaria, we continue with Claritin in the morning and Singulair at night.  We also recommended continued avoidance red meats.  Her environmental allergy symptoms are under control with the loratadine and montelukast.  Since last visit, she has done well.   Her arthritis is not under great control. She is on gabapentin.   Asthma/Respiratory Symptom History: She has not been using her albuterol much at all. She has not been using anything on a daily basis. She has not been on prednisone at all. She denies nighttime coughing or wheezing. She has not been to the hospital for her symptoms at all.   Allergic Rhinitis Symptom History: She is currently taking the Claritin and Singulair. These two medications are helping to control her allergic rhinitis symptoms. She has not been on antibiotics at all for her symptoms.   Food Allergy Symptom History: She continues to avoid red  meats. She is interested in  retesting her levels to see if she can introduce this back into her diet. Her last IgE to alpha gal was > 100, so I am not optimistic. But we are going to try anyway.   Skin Symptom History: She has been having some breakouts on her face.  But her hives are overall well controlled and quiescent.   Otherwise, there have been no changes to her past medical history, surgical history, family history, or social history.    Review  of Systems  Constitutional: Negative.  Negative for chills, fever, malaise/fatigue and weight loss.  HENT:  Positive for congestion. Negative for ear discharge, ear pain and sinus pain.   Eyes:  Negative for pain, discharge and redness.  Respiratory:  Negative for cough, sputum production, shortness of breath and wheezing.   Cardiovascular: Negative.  Negative for chest pain and palpitations.  Gastrointestinal:  Negative for abdominal pain, constipation, diarrhea, heartburn, nausea and vomiting.  Skin: Negative.  Negative for itching and rash.  Neurological:  Negative for dizziness and headaches.  Endo/Heme/Allergies:  Positive for environmental allergies. Does not bruise/bleed easily.       Objective:   Blood pressure 126/70, pulse (!) 106, temperature 98.2 F (36.8 C), resp. rate 18, height 4' 11.45" (1.51 m), weight 171 lb 8 oz (77.8 kg), SpO2 95 %. Body mass index is 34.12 kg/m.    Physical Exam Vitals reviewed.  Constitutional:      Appearance: She is well-developed.  HENT:     Head: Normocephalic and atraumatic.     Right Ear: Tympanic membrane, ear canal and external ear normal. No drainage, swelling or tenderness. Tympanic membrane is not injected, scarred, erythematous, retracted or bulging.     Left Ear: Tympanic membrane, ear canal and external ear normal. No drainage, swelling or tenderness. Tympanic membrane is not injected, scarred, erythematous, retracted or bulging.     Nose: No nasal deformity, septal deviation, mucosal edema or rhinorrhea.     Right Turbinates: Enlarged, swollen and pale.     Left Turbinates: Enlarged, swollen and pale.     Right Sinus: No maxillary sinus tenderness or frontal sinus tenderness.     Left Sinus: No maxillary sinus tenderness or frontal sinus tenderness.     Comments: Runny nose.    Mouth/Throat:     Mouth: Mucous membranes are not pale and not dry.     Pharynx: Uvula midline.  Eyes:     General:        Right eye: No  discharge.        Left eye: No discharge.     Conjunctiva/sclera: Conjunctivae normal.     Right eye: Right conjunctiva is not injected. No chemosis.    Left eye: Left conjunctiva is not injected. No chemosis.    Pupils: Pupils are equal, round, and reactive to light.  Cardiovascular:     Rate and Rhythm: Normal rate and regular rhythm.     Heart sounds: Normal heart sounds.  Pulmonary:     Effort: Pulmonary effort is normal. No tachypnea, accessory muscle usage or respiratory distress.     Breath sounds: Normal breath sounds. No wheezing, rhonchi or rales.  Chest:     Chest wall: No tenderness.  Lymphadenopathy:     Head:     Right side of head: No submandibular, tonsillar or occipital adenopathy.     Left side of head: No submandibular, tonsillar or occipital adenopathy.     Cervical: No cervical adenopathy.  Skin:    General: Skin is warm.     Capillary Refill: Capillary refill takes less than 2 seconds.     Coloration: Skin is not pale.     Findings: No abrasion, erythema, petechiae or rash. Rash is not papular, urticarial or vesicular.     Comments: Some excoriations present. No urticaria appreciated today.   Neurological:     Mental Status: She is alert.  Psychiatric:        Behavior: Behavior is cooperative.      Diagnostic studies:    Spirometry: results normal (FEV1: 1.36/94%, FVC: 1.39/75%, FEV1/FVC: 98%).    Spirometry consistent with normal pattern.   Allergy Studies: labs sent instead        Salvatore Marvel, MD  Allergy and [place]

## 2023-02-24 ENCOUNTER — Encounter: Payer: Self-pay | Admitting: Allergy & Immunology

## 2023-02-27 LAB — ALPHA-GAL PANEL
Allergen Lamb IgE: 4.01 kU/L — AB
Beef IgE: 4.04 kU/L — AB
IgE (Immunoglobulin E), Serum: 414 IU/mL (ref 6–495)
O215-IgE Alpha-Gal: 21.2 kU/L — AB
Pork IgE: 1.38 kU/L — AB

## 2023-03-03 ENCOUNTER — Other Ambulatory Visit: Payer: Self-pay | Admitting: *Deleted

## 2023-03-03 DIAGNOSIS — I10 Essential (primary) hypertension: Secondary | ICD-10-CM

## 2023-03-03 MED ORDER — AMLODIPINE BESYLATE 5 MG PO TABS: 5.0000 mg | ORAL_TABLET | Freq: Every day | ORAL | 0 refills | Status: DC

## 2023-03-11 ENCOUNTER — Other Ambulatory Visit: Payer: Self-pay

## 2023-03-11 MED ORDER — GABAPENTIN 300 MG PO CAPS: 300.0000 mg | ORAL_CAPSULE | Freq: Two times a day (BID) | ORAL | 0 refills | Status: DC

## 2023-03-11 NOTE — Telephone Encounter (Signed)
Prescription Request  03/11/2023  LOV: Visit date not found  What is the name of the medication or equipment? gabapentin (NEURONTIN) 300 MG capsule   Have you contacted your pharmacy to request a refill? Yes   Which pharmacy would you like this sent to? [facility]    Patient notified that their request is being sent to the clinical staff for review and that they should receive a response within 2 business days.   Please advise at Mobile There is no such number on file (mobile).

## 2023-03-12 ENCOUNTER — Ambulatory Visit (INDEPENDENT_AMBULATORY_CARE_PROVIDER_SITE_OTHER): Payer: Medicare Other | Admitting: Family Medicine

## 2023-03-12 VITALS — BP 119/76 | HR 95 | Wt 171.8 lb

## 2023-03-12 DIAGNOSIS — J301 Allergic rhinitis due to pollen: Secondary | ICD-10-CM

## 2023-03-12 NOTE — Progress Notes (Signed)
   Subjective:    Patient ID: Valerie Thomas, female    DOB: [birth year], 77 y.o.   MRN: [id number]  HPI Patient arrives today ear issues. Patient states that her left ear pain and throat pain is better today. She relates a lot of head congestion drainage coughing in addition to this ear pain throat pain that was present for the past couple days but seemingly better today.  No wheezing or difficulty breathing.   Review of Systems     Objective:   Physical Exam  Her ears look good bilateral little bit of wax on the right side throat is normal neck no masses lungs are clear respiratory rate normal heart regular      Assessment & Plan:  Viral-like illness versus allergic rhinitis Recommend allergy medicine Hold off on antibiotics Warning signs discussed If progressive troubles or problems to follow-up No need for x-rays or lab work currently

## 2023-03-12 NOTE — Patient Instructions (Addendum)
Hi Valerie Thomas  I believe what is going on with you is more related to allergies.  Please continue your allergy medicine.  Should you get worse such as difficulty breathing in your lungs or fevers please let us know so we can recheck you.  Should you have any concerns or questions please give [place] a call.  Please follow-up with Dr. Adriana Simas in August as planned.  TakeCare-Dr. Lorin Picket Clydene Laming Please  From [place] seeing you allergic Rhinitis, Adult  Allergic rhinitis is a reaction to allergens. Allergens are things that can cause an allergic reaction. This condition affects the lining inside the nose (mucous membrane). There are two types of allergic rhinitis: Seasonal. This type is also called hay fever. It happens only during some times of the year. Perennial. This type can happen at any time of the year. This condition cannot be spread from person to person (is not contagious). It can be mild, bad, or very bad. It can develop at any age and may be outgrown. What are the causes? Pollen from grasses, trees, and weeds. Other causes can be: Dust mites. Smoke. Mold. Car fumes. The pee (urine), spit, or dander of pets. Dander is dead skin cells from a pet. What increases the risk? You are more likely to develop this condition if: You have allergies in your family. You have problems like allergies in your family. You may have: Swelling of parts of your eyes and eyelids. Asthma. This affects how you breathe. Long-term redness and swelling on your skin. Food allergies. What are the signs or symptoms? The main symptom of this condition is a runny or stuffy nose (nasal congestion). Other symptoms may include: Sneezing or coughing. Itching and tearing of your eyes. Mucus that drips down the back of your throat (postnasal drip). This may cause a sore throat. Trouble sleeping. Feeling tired. Headache. How is this treated? There is no cure for this condition. You should avoid things  that you are allergic to. Treatment can help to relieve symptoms. This may include: Medicines that block allergy symptoms, such as anti-inflammatories or antihistamines. These may be given as a shot, nasal spray, or pill. Avoiding things you are allergic to. Medicines that give you some of what you are allergic to over time. This is called immunotherapy. It is done if other treatments do not help. You may get: Shots. Medicine under your tongue. Stronger medicines, if other treatments do not help. Follow these instructions at home: Avoiding allergens Find out what things you are allergic to and avoid them. To do this, try these things: If you get allergies any time of year: Replace carpet with wood, tile, or vinyl flooring. Carpet can trap pet dander and dust. Do not smoke. Do not allow smoking in your home. Change your heating and air conditioning filters at least once a month. If you get allergies only some times of the year: Keep windows closed when you can. Plan things to do outside when pollen counts are lowest. Check pollen counts before you plan things to do outside. When you come indoors, change your clothes and shower before you sit on furniture or bedding. If you are allergic to a pet: Keep the pet out of your bedroom. Vacuum, sweep, and dust often. General instructions Take over-the-counter and prescription medicines only as told by your doctor. Drink enough fluid to keep your pee pale yellow. Where to find more information American Academy of Allergy, Asthma & Immunology: aaaai.org Contact a doctor if:  You have a fever. You get a cough that does not go away. You make high-pitched whistling sounds when you breathe, most often when you breathe out (wheeze). Your symptoms slow you down. Your symptoms stop you from doing your normal things each day. Get help right away if: You are short of breath. This symptom may be an emergency. Get help right away. Call 911. Do not wait  to see if the symptom will go away. Do not drive yourself to the hospital. This information is not intended to replace advice given to you by your health care provider. Make sure you discuss any questions you have with your health care provider. Document Revised: 07/29/2022 Document Reviewed: 07/29/2022 Elsevier Patient Education  2023 [employer].

## 2023-03-31 ENCOUNTER — Ambulatory Visit (HOSPITAL_COMMUNITY)
Admission: RE | Admit: 2023-03-31 | Discharge: 2023-03-31 | Disposition: A | Discharge status: Home / Self Care | Payer: Medicare Other | Source: Ambulatory Visit | Attending: Family Medicine | Admitting: Family Medicine

## 2023-03-31 ENCOUNTER — Encounter (HOSPITAL_COMMUNITY): Payer: Self-pay

## 2023-03-31 DIAGNOSIS — Z1231 Encounter for screening mammogram for malignant neoplasm of breast: Secondary | ICD-10-CM | POA: Diagnosis not present

## 2023-04-12 ENCOUNTER — Other Ambulatory Visit: Payer: Self-pay | Admitting: Allergy & Immunology

## 2023-04-12 ENCOUNTER — Other Ambulatory Visit: Payer: Self-pay | Admitting: Family Medicine

## 2023-04-12 DIAGNOSIS — I10 Essential (primary) hypertension: Secondary | ICD-10-CM

## 2023-04-19 ENCOUNTER — Other Ambulatory Visit: Payer: Self-pay | Admitting: Family Medicine

## 2023-05-28 ENCOUNTER — Telehealth: Payer: Self-pay | Admitting: Family Medicine

## 2023-05-28 NOTE — Telephone Encounter (Signed)
Refill on  gabapentin (NEURONTIN) 300 MG capsule  send to [facility]

## 2023-05-29 ENCOUNTER — Other Ambulatory Visit: Payer: Self-pay | Admitting: Family Medicine

## 2023-05-29 MED ORDER — GABAPENTIN 300 MG PO CAPS: 300.0000 mg | ORAL_CAPSULE | Freq: Two times a day (BID) | ORAL | 3 refills | Status: DC

## 2023-06-10 ENCOUNTER — Other Ambulatory Visit: Payer: Self-pay | Admitting: Family Medicine

## 2023-06-10 DIAGNOSIS — I10 Essential (primary) hypertension: Secondary | ICD-10-CM

## 2023-06-15 ENCOUNTER — Other Ambulatory Visit: Payer: Self-pay | Admitting: Family Medicine

## 2023-07-07 ENCOUNTER — Telehealth: Payer: Self-pay

## 2023-07-07 NOTE — Telephone Encounter (Signed)
Prescription Request  07/07/2023  LOV: Visit date not found  What is the name of the medication or equipment? potassium chloride SA (KLOR-CON M) 20 MEQ tablet   Have you contacted your pharmacy to request a refill? Yes   Which pharmacy would you like this sent to? [facility]   Patient notified that their request is being sent to the clinical staff for review and that they should receive a response within 2 business days.   Please advise at Mobile There is no such number on file (mobile).

## 2023-07-08 MED ORDER — POTASSIUM CHLORIDE CRYS ER 20 MEQ PO TBCR: 20.0000 meq | EXTENDED_RELEASE_TABLET | Freq: Two times a day (BID) | ORAL | 2 refills | Status: DC

## 2023-07-14 ENCOUNTER — Ambulatory Visit (INDEPENDENT_AMBULATORY_CARE_PROVIDER_SITE_OTHER): Payer: Medicare Other | Admitting: Family Medicine

## 2023-07-14 ENCOUNTER — Ambulatory Visit (HOSPITAL_COMMUNITY)
Admission: RE | Admit: 2023-07-14 | Discharge: 2023-07-14 | Disposition: A | Discharge status: Home / Self Care | Payer: Medicare Other | Source: Ambulatory Visit | Attending: Family Medicine | Admitting: Family Medicine

## 2023-07-14 VITALS — BP 120/80 | HR 92 | Temp 98.1°F | Ht 59.45 in | Wt 164.2 lb

## 2023-07-14 DIAGNOSIS — M1711 Unilateral primary osteoarthritis, right knee: Secondary | ICD-10-CM | POA: Diagnosis not present

## 2023-07-14 DIAGNOSIS — M7651 Patellar tendinitis, right knee: Secondary | ICD-10-CM | POA: Diagnosis not present

## 2023-07-14 DIAGNOSIS — R2241 Localized swelling, mass and lump, right lower limb: Secondary | ICD-10-CM

## 2023-07-14 DIAGNOSIS — M25561 Pain in right knee: Secondary | ICD-10-CM | POA: Insufficient documentation

## 2023-07-14 NOTE — Patient Instructions (Signed)
Xray at the hospital.  We will schedule the Ultrasound.  Take care  Dr. Adriana Simas

## 2023-07-15 ENCOUNTER — Other Ambulatory Visit: Payer: Self-pay

## 2023-07-15 ENCOUNTER — Other Ambulatory Visit: Payer: Self-pay | Admitting: *Deleted

## 2023-07-15 DIAGNOSIS — R2241 Localized swelling, mass and lump, right lower limb: Secondary | ICD-10-CM

## 2023-07-15 DIAGNOSIS — M25569 Pain in unspecified knee: Secondary | ICD-10-CM

## 2023-07-15 DIAGNOSIS — J4520 Mild intermittent asthma, uncomplicated: Secondary | ICD-10-CM

## 2023-07-15 MED ORDER — ALBUTEROL SULFATE HFA 108 (90 BASE) MCG/ACT IN AERS: INHALATION_SPRAY | RESPIRATORY_TRACT | 0 refills | Status: DC

## 2023-07-15 NOTE — Assessment & Plan Note (Signed)
X-rays obtained and revealed significant degenerative changes.  Referring to orthopedics.  Patient can continue her tramadol as needed.

## 2023-07-15 NOTE — Assessment & Plan Note (Signed)
Given the length of time that she has had this, this is likely benign.  We discussed that if this is bothering her she will need surgical removal.  Patient wants to get further information in regards to the nature of the mass.  Arranging for ultrasound.

## 2023-07-15 NOTE — Progress Notes (Signed)
Subjective:  Patient ID: Valerie Thomas, female    DOB: 1946/03/06  Age: 77 y.o. MRN: [id number]  CC:  Knee pain   HPI:  77 year old female presents for evaluation of the above.  Patient reports a 2 to 3-week history of right knee pain.  Has been quite painful.  She states that she has had some pain in the lower leg as well as the proximal leg as well.  She states that she has a mass on the posterior aspect of her right thigh that is now bothering her.  She states that she has had this for over 30 years.  She is concerned as it is now causing some discomfort.  Patient Active Problem List   Diagnosis Date Noted   Mass of right thigh 07/15/2023   Knee pain 01/23/2023   History of urticaria 10/22/2022   Hyperlipidemia associated with type 2 diabetes mellitus (HCC) 10/22/2022   Hypothyroidism 10/22/2022   Obesity 02/13/2022   Gastroesophageal reflux disease 02/13/2022   Diabetes mellitus without complication (HCC) 02/13/2022   Hypertension 02/13/2022   Vertigo 02/06/2022    Social Hx   Social History   Socioeconomic History   Marital status: Widowed    Spouse name: Not on file   Number of children: Not on file   Years of education: Not on file   Highest education level: Not on file  Occupational History   Not on file  Tobacco Use   Smoking status: Never    Passive exposure: Past   Smokeless tobacco: Never  Substance and Sexual Activity   Alcohol use: No   Drug use: No   Sexual activity: Never  Other Topics Concern   Not on file  Social History Narrative   Not on file   Social Determinants of Health   Financial Resource Strain: High Risk (05/28/2022)   Overall Financial Resource Strain (CARDIA)    Difficulty of Paying Living Expenses: Hard  Food Insecurity: No Food Insecurity (05/28/2022)   Hunger Vital Sign    Worried About Running Out of Food in the Last Year: Never true    Ran Out of Food in the Last Year: Never true  Transportation Needs: No Transportation  Needs (05/28/2022)   PRAPARE - Administrator, Civil Service (Medical): No    Lack of Transportation (Non-Medical): No  Physical Activity: Insufficiently Active (05/28/2022)   Exercise Vital Sign    Days of Exercise per Week: 3 days    Minutes of Exercise per Session: 30 min  Stress: No Stress Concern Present (05/28/2022)   [employer] of Occupational Health - Occupational Stress Questionnaire    Feeling of Stress : Not at all  Social Connections: Socially Integrated (05/28/2022)   Social Connection and Isolation Panel [NHANES]    Frequency of Communication with Friends and Family: More than three times a week    Frequency of Social Gatherings with Friends and Family: More than three times a week    Attends Religious Services: More than 4 times per year    Active Member of [employer] or Organizations: Yes    Attends Engineer, structural: More than 4 times per year    Marital Status: Married    Review of Systems Per HPI  Objective:  BP 120/80   Pulse 92   Temp 98.1 F (36.7 C)   Ht 4' 11.45" (1.51 m)   Wt 164 lb 3.2 oz (74.5 kg)   SpO2 99%   BMI 32.66 kg/m  07/14/2023    2:45 PM 03/12/2023    4:11 PM 02/21/2023    2:56 PM  BP/Weight  Systolic BP 120 119 126  Diastolic BP 80 76 70  Wt. (Lbs) 164.2 171.8 171.5  BMI 32.66 kg/m2 34.18 kg/m2 34.12 kg/m2    Physical Exam Constitutional:      General: She is not in acute distress.    Appearance: Normal appearance.  HENT:     Head: Normocephalic and atraumatic.  Cardiovascular:     Rate and Rhythm: Normal rate and regular rhythm.  Pulmonary:     Effort: Pulmonary effort is normal.     Breath sounds: Normal breath sounds.  Musculoskeletal:     Comments: Right knee -mild swelling noted.  No tenderness on exam.  Joint is overall enlarged, suggestive of underlying osteoarthritis.  Skin:    Comments: Right posterior thigh with a palpable firm mass.  Neurological:     Mental Status: She is alert.      Lab Results  Component Value Date   WBC 11.2 (H) 06/21/2022   HGB 12.3 06/21/2022   HCT 38.8 06/21/2022   PLT 179 02/06/2022   GLUCOSE 110 (H) 10/07/2022   CHOL 101 10/07/2022   TRIG 64 10/07/2022   HDL 51 10/07/2022   LDLCALC 36 10/07/2022   ALT 9 10/07/2022   AST 18 10/07/2022   NA 143 10/07/2022   K 3.9 10/07/2022   CL 103 10/07/2022   CREATININE 0.65 10/07/2022   BUN 10 10/07/2022   CO2 25 10/07/2022   TSH 0.177 (L) 01/22/2023   HGBA1C 6.5 (H) 10/07/2022     Assessment & Plan:   Problem List Items Addressed This Visit       Other   Mass of right thigh    Given the length of time that she has had this, this is likely benign.  We discussed that if this is bothering her she will need surgical removal.  Patient wants to get further information in regards to the nature of the mass.  Arranging for ultrasound.      Relevant Orders   [place] RT LOWER EXTREM LTD SOFT TISSUE NON VASCULAR   Knee pain - Primary    X-rays obtained and revealed significant degenerative changes.  Referring to orthopedics.  Patient can continue her tramadol as needed.      Relevant Orders   DG Knee Complete 4 Views Right (Completed)   Ambulatory referral to Orthopedic Surgery    Casmer Yepiz Adriana Simas DO [facility]

## 2023-07-23 ENCOUNTER — Ambulatory Visit: Payer: Medicare Other | Admitting: Orthopedic Surgery

## 2023-07-23 ENCOUNTER — Ambulatory Visit: Payer: Medicare Other | Admitting: Family Medicine

## 2023-07-28 ENCOUNTER — Ambulatory Visit (HOSPITAL_COMMUNITY)
Admission: RE | Admit: 2023-07-28 | Discharge: 2023-07-28 | Disposition: A | Discharge status: Home / Self Care | Payer: Medicare Other | Source: Ambulatory Visit | Attending: Family Medicine | Admitting: Family Medicine

## 2023-07-28 DIAGNOSIS — R2241 Localized swelling, mass and lump, right lower limb: Secondary | ICD-10-CM | POA: Insufficient documentation

## 2023-07-30 ENCOUNTER — Ambulatory Visit: Payer: Medicare Other | Admitting: Orthopedic Surgery

## 2023-07-30 ENCOUNTER — Encounter: Payer: Self-pay | Admitting: Orthopedic Surgery

## 2023-07-30 VITALS — BP 127/79 | HR 85 | Ht 59.5 in | Wt 165.4 lb

## 2023-07-30 DIAGNOSIS — R2241 Localized swelling, mass and lump, right lower limb: Secondary | ICD-10-CM

## 2023-07-30 NOTE — Progress Notes (Signed)
New Patient Visit  Assessment: Valerie Thomas is a 77 y.o. female with the following: 1. Leg mass, right  Plan: Valerie Thomas has a mass in the right posterior thigh, which started to become painful.  Ultrasound results were not available during her clinic visit.  However, the appearance and presentation consistent with a benign process.  Nothing is apparent on x-rays.  I do not think that this needs to change her level of activity.  If her symptoms worsen, or the mass grows, or she notices any overlying skin changes, she should return to clinic.  Otherwise, continue with activities as tolerated.  If this mass is consistent with a lipoma, nothing further is needed.  However, if she continues to have pain, we can discuss possible removal as needed.  Prior to considering surgery, I would recommend obtaining an MRI, to further evaluate the subcutaneous soft tissue growth.  Follow-up: Return if symptoms worsen or fail to improve.  Subjective:  Chief Complaint  Patient presents with   Leg Pain    R thigh mass that has become painful in the past 1-2 wks. Has had xray and [place], here for results.     History of Present Illness: Valerie Thomas is a 77 y.o. female who has been referred by Everlene Other, DO for evaluation of right leg swelling.  She has noticed the swelling in her right leg for years.  More recently, she started to have some pain in the back of the right thigh.  Her health has remained stable.  She walks with a cane.  No recent injuries.  Occasional pain in the back of the knee.  Occasional pains radiating down the posterior leg, towards her foot.   Review of Systems: No fevers or chills No numbness or tingling No chest pain No shortness of breath No bowel or bladder dysfunction No GI distress No headaches   Medical History:  Past Medical History:  Diagnosis Date   Acid reflux    Asthma    Diabetes mellitus without complication (HCC)    Eczema    Hypercholesteremia     Hypertension    Thyroid disease     Past Surgical History:  Procedure Laterality Date   ABDOMINAL HYSTERECTOMY     HAND SURGERY     THYROID SURGERY     torn ligament     neck surgery    UTERINE FIBROID SURGERY      Family History  Problem Relation Age of Onset   Asthma Sister    Allergic rhinitis Neg Hx    Urticaria Neg Hx    Eczema Neg Hx    Social History   Tobacco Use   Smoking status: Never    Passive exposure: Past   Smokeless tobacco: Never  Substance Use Topics   Alcohol use: No   Drug use: No    Allergies  Allergen Reactions   Alpha-Gal Other (See Comments)    Itching, diarrhea    No outpatient medications have been marked as taking for the 07/30/23 encounter (Office Visit) with Oliver Barre, MD.    Objective: BP 127/79   Pulse 85   Ht 4' 11.5" (1.511 m)   Wt 165 lb 6.4 oz (75 kg)   BMI 32.85 kg/m   Physical Exam:  General: Elderly female., Alert and oriented., and No acute distress. Gait: Ambulates with the assistance of a cane.  Evaluation of the right leg demonstrates no obvious deformity.  There is a mobile, compressible mass  in the posterior and medial aspect of the thigh.  No overlying skin changes.  This is not tender to palpation.  She good range of motion of the right knee.  No obvious Baker's cyst.  No pain with straight leg raise.   IMAGING: I personally reviewed images previously obtained in clinic  X-rays of the right leg were previously obtained.  No obvious calcifications or soft tissue mass.  No issues with the cortex of the femur.   New Medications:  No orders of the defined types were placed in this encounter.     Oliver Barre, MD  07/30/2023 4:14 PM

## 2023-08-05 ENCOUNTER — Telehealth: Payer: Self-pay

## 2023-08-05 NOTE — Telephone Encounter (Signed)
Patient left message wanting to speak with Dr. Dallas Schimke' nurse something about a MRI appointment. I didn't see anything in the last note about a MRI being ordered.  Please call and advise  [phone]

## 2023-08-05 NOTE — Telephone Encounter (Signed)
Let pt know that talking to Dr. Adriana Simas about the MRI would be better if she would like further answers on what the nodule is. Pt verbalized understanding.

## 2023-08-06 NOTE — Telephone Encounter (Signed)
Dr. Dallas Schimke pt - pt lvm stating she forgot to ask if she needs to take an antibiotic for her tumor.

## 2023-08-06 NOTE — Telephone Encounter (Signed)
Pt stopped by clinic let her know what was seen in her knee x ray and that she doesn't need antibiotics for it. Let her know that provider is willing to give her an injection in the knee if her pain increases. Verbalized understanding.

## 2023-08-07 ENCOUNTER — Other Ambulatory Visit: Payer: Self-pay

## 2023-08-07 DIAGNOSIS — R2241 Localized swelling, mass and lump, right lower limb: Secondary | ICD-10-CM

## 2023-08-13 ENCOUNTER — Telehealth: Payer: Self-pay | Admitting: Family Medicine

## 2023-08-13 MED ORDER — LEVOTHYROXINE SODIUM 75 MCG PO TABS: 75.0000 ug | ORAL_TABLET | Freq: Every day | ORAL | 0 refills | Status: DC

## 2023-08-13 NOTE — Telephone Encounter (Signed)
Patient is requesting a refill for 4 pills until her levothyroxine (SYNTHROID) 75 MCG tablet  medication come from [facility] order she is completely out. She wanted to know in she could take her old prescription of 88 mg until her medication came in. Please advis

## 2023-08-13 NOTE — Telephone Encounter (Signed)
Spoke with patient and advised per Dr Adriana Simas  No I would not advise this. I can send it I locally if needed.  Patient verbalized understanding. Prescription sent to [facility] pharmacy.

## 2023-08-18 ENCOUNTER — Ambulatory Visit (HOSPITAL_COMMUNITY)
Admission: RE | Admit: 2023-08-18 | Discharge: 2023-08-18 | Disposition: A | Discharge status: Home / Self Care | Payer: Medicare Other | Source: Ambulatory Visit | Attending: Family Medicine | Admitting: Family Medicine

## 2023-08-18 DIAGNOSIS — M25461 Effusion, right knee: Secondary | ICD-10-CM | POA: Diagnosis not present

## 2023-08-18 DIAGNOSIS — R2241 Localized swelling, mass and lump, right lower limb: Secondary | ICD-10-CM | POA: Diagnosis not present

## 2023-08-18 DIAGNOSIS — M1711 Unilateral primary osteoarthritis, right knee: Secondary | ICD-10-CM | POA: Diagnosis not present

## 2023-08-18 DIAGNOSIS — M7121 Synovial cyst of popliteal space [Baker], right knee: Secondary | ICD-10-CM | POA: Diagnosis not present

## 2023-08-18 MED ORDER — GADOBUTROL 1 MMOL/ML IV SOLN
10.0000 mL | Freq: Once | INTRAVENOUS | Status: AC | PRN
  Administered 2023-08-18: 10 mL via INTRAVENOUS

## 2023-08-29 ENCOUNTER — Ambulatory Visit: Payer: Medicare Other | Admitting: Allergy & Immunology

## 2023-09-16 ENCOUNTER — Other Ambulatory Visit: Payer: Self-pay | Admitting: Family Medicine

## 2023-09-17 ENCOUNTER — Telehealth: Payer: Self-pay | Admitting: Family Medicine

## 2023-09-17 NOTE — Telephone Encounter (Signed)
Refill on gabapentin (NEURONTIN) 300 MG capsule  patient states completely out. Send to [facility]

## 2023-09-24 ENCOUNTER — Ambulatory Visit: Payer: Medicare Other | Admitting: Family Medicine

## 2023-09-24 ENCOUNTER — Encounter: Payer: Self-pay | Admitting: Family Medicine

## 2023-09-24 VITALS — BP 106/64 | HR 100 | Temp 98.2°F | Resp 16 | Wt 168.1 lb

## 2023-09-24 DIAGNOSIS — Z91018 Allergy to other foods: Secondary | ICD-10-CM

## 2023-09-24 DIAGNOSIS — J4520 Mild intermittent asthma, uncomplicated: Secondary | ICD-10-CM | POA: Diagnosis not present

## 2023-09-24 DIAGNOSIS — T7800XD Anaphylactic reaction due to unspecified food, subsequent encounter: Secondary | ICD-10-CM | POA: Diagnosis not present

## 2023-09-24 DIAGNOSIS — J301 Allergic rhinitis due to pollen: Secondary | ICD-10-CM

## 2023-09-24 DIAGNOSIS — L508 Other urticaria: Secondary | ICD-10-CM | POA: Diagnosis not present

## 2023-09-24 MED ORDER — EPINEPHRINE 0.3 MG/0.3ML IJ SOAJ: 0.3000 mg | INTRAMUSCULAR | 1 refills | Status: AC | PRN

## 2023-09-24 NOTE — Patient Instructions (Signed)
Asthma Continue montelukast 10 mg once a day to prevent cough or wheeze Continue continue albuterol 2 puffs once every 4 hours as needed for cough or wheeze You may use albuterol 2 puffs 5 to 15 minutes before activity to decrease cough or wheeze  Allergic rhinitis Continue allergen avoidance measures directed toward tree pollen as listed below Continue Flonase 2 sprays in each nostril once a day as needed for stuffy nose Begin azelastine 2 sprays in each nostril once or twice a day as needed for runny nose. If needed for a runny nose or itch you may continue Claritin. Be aware that this medication may make you sleepy.  Consider saline nasal rinses as needed for nasal symptoms. Use this before any medicated nasal sprays for best result  Alpha gal/urticaria Continue to avoid mammalian meat.  In case of an allergic reaction, give Benadryl 50 mg every 4 hours, and if life-threatening symptoms occur, inject with EpiPen 0.3 mg. A lab order has been placed to help [place] evaluate your alpha gal allergy.  We will call you when the results become available.   Call the clinic if this treatment plan is not working well for you.  Follow up in 6 months or sooner if needed.  Reducing Pollen Exposure The American Academy of Allergy, Asthma and Immunology suggests the following steps to reduce your exposure to pollen during allergy seasons. Do not hang sheets or clothing out to dry; pollen may collect on these items. Do not mow lawns or spend time around freshly cut grass; mowing stirs up pollen. Keep windows closed at night.  Keep car windows closed while driving. Minimize morning activities outdoors, a time when pollen counts are usually at their highest. Stay indoors as much as possible when pollen counts or humidity is high and on windy days when pollen tends to remain in the air longer. Use air conditioning when possible.  Many air conditioners have filters that trap the pollen spores. Use a HEPA room  air filter to remove pollen form the indoor air you breathe.

## 2023-09-24 NOTE — Progress Notes (Signed)
[address] [place]  [place] [address] Dept: [phone]  FOLLOW UP NOTE  Patient ID: Valerie Thomas, female    DOB: 20-Nov-1946  Age: 77 y.o. MRN: [id number] Date of Office Visit: 09/24/2023  Assessment  Chief Complaint: Follow-up (Wants to know if theres a change in anything that she can have such as meat /Started to drink milk a couple months ago and has not had an issue. )  HPI Valerie Thomas is a 77 year old female who presents to the clinic for follow-up visit.  She was last seen in this clinic on 02/21/2023 by Dr. Dellis Anes for evaluation of asthma, allergic rhinitis, chronic urticaria, and alpha gal allergy.  At today's visit, she reports her asthma has been moderately well-controlled with occasional shortness of breath especially with activity and infrequent dry cough which is relieved with albuterol.  She continues montelukast 10 mg once a day and uses albuterol before activity.  She reports that she rarely needs to use albuterol for relief of asthma symptoms.  Allergic rhinitis is reported as moderately well-controlled with occasional sneezing as the main symptom.  She continues Flonase and azelastine most days, however, she is currently out of these medications.  She is not currently using a nasal saline rinse.  She continues to avoid mammalian meat and has not had any outbreak of urticaria since her last visit to this clinic.  She reports that she is drinking milk and eating products containing milk without adverse reaction.  She does report that [place] where she consumed a ham biscuit without adverse reaction.  She is interested in retesting her alpha gal levels at this time.  She denies any new tick bites, however, she does left to work in the garden with her flowers.  Her current medications are listed in the chart.    Drug Allergies:  Allergies  Allergen Reactions   Alpha-Gal Other (See Comments)    Itching, diarrhea     Physical Exam: BP 106/64   Pulse 100   Temp 98.2 F (36.8 C)   Resp 16   Wt 168 lb 2 oz (76.3 kg)   SpO2 96%   BMI 33.39 kg/m    Physical Exam Vitals reviewed.  Constitutional:      Appearance: Normal appearance.  HENT:     Head: Normocephalic and atraumatic.     Right Ear: Tympanic membrane normal.     Left Ear: Tympanic membrane normal.     Nose:     Comments: Bilateral nares slightly erythematous with thin clear nasal drainage noted.  Pharynx normal.  Ears normal.  Eyes normal.    Mouth/Throat:     Pharynx: Oropharynx is clear.  Eyes:     Conjunctiva/sclera: Conjunctivae normal.  Cardiovascular:     Rate and Rhythm: Normal rate and regular rhythm.     Heart sounds: Normal heart sounds.  Pulmonary:     Effort: Pulmonary effort is normal.     Breath sounds: Normal breath sounds.     Comments: Lungs clear to auscultation Musculoskeletal:        General: Normal range of motion.     Cervical back: Normal range of motion and neck supple.  Skin:    General: Skin is warm and dry.  Neurological:     Mental Status: She is alert and oriented to person, place, and time.  Psychiatric:        Mood and Affect: Mood normal.  Behavior: Behavior normal.        Thought Content: Thought content normal.        Judgment: Judgment normal.     Diagnostics: FVC 1.65 which is 89% of predicted value, FEV1 1.46 which is 102% of predicted value.  Spirometry indicates normal ventilatory function.  Assessment and Plan: 1. 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 Anaphylactic shock due to food, subsequent encounter   4. Chronic urticaria   5. Seasonal allergic rhinitis due to pollen     Meds ordered this encounter  Medications   EPINEPHrine 0.3 mg/0.3 mL IJ SOAJ injection    Sig: Inject 0.3 mg into the muscle as needed for anaphylaxis.    Dispense:  1 each    Refill:  1   fluticasone (FLONASE) 50 MCG/ACT nasal spray    Sig: Place 2  sprays into both nostrils daily as needed (stuffy nose).    Dispense:  16 g    Refill:  5   azelastine (ASTELIN) 0.1 % nasal spray    Sig: Place 2 sprays in each nostril once or twice a day as needed for a runny nose    Dispense:  30 mL    Refill:  5    Patient Instructions  Asthma Continue montelukast 10 mg once a day to prevent cough or wheeze Continue continue albuterol 2 puffs once every 4 hours as needed for cough or wheeze You may use albuterol 2 puffs 5 to 15 minutes before activity to decrease cough or wheeze  Allergic rhinitis Continue allergen avoidance measures directed toward tree pollen as listed below Continue Flonase 2 sprays in each nostril once a day as needed for stuffy nose Begin azelastine 2 sprays in each nostril once or twice a day as needed for runny nose Consider saline nasal rinses as needed for nasal symptoms. Use this before any medicated nasal sprays for best result  Alpha gal/urticaria Continue to avoid mammalian meat.  In case of an allergic reaction, give Benadryl 50 mg every 4 hours, and if life-threatening symptoms occur, inject with EpiPen 0.3 mg. A lab order has been placed to help [place] evaluate your alpha gal allergy.  We will call you when the results become available.   Call the clinic if this treatment plan is not working well for you.  Follow up in 6 months or sooner if needed.   Return in about 6 months (around 03/24/2024), or if symptoms worsen or fail to improve.    Thank you for the opportunity to care for this patient.  Please do not hesitate to contact me with questions.  Thermon Leyland, FNP [facility]

## 2023-09-25 ENCOUNTER — Telehealth: Payer: Self-pay | Admitting: *Deleted

## 2023-09-25 ENCOUNTER — Encounter: Payer: Self-pay | Admitting: Family Medicine

## 2023-09-25 DIAGNOSIS — J4520 Mild intermittent asthma, uncomplicated: Secondary | ICD-10-CM | POA: Insufficient documentation

## 2023-09-25 DIAGNOSIS — J301 Allergic rhinitis due to pollen: Secondary | ICD-10-CM | POA: Insufficient documentation

## 2023-09-25 DIAGNOSIS — Z91018 Allergy to other foods: Secondary | ICD-10-CM | POA: Insufficient documentation

## 2023-09-25 DIAGNOSIS — T7800XA Anaphylactic reaction due to unspecified food, initial encounter: Secondary | ICD-10-CM | POA: Insufficient documentation

## 2023-09-25 MED ORDER — AZELASTINE HCL 0.1 % NA SOLN: NASAL | 5 refills | Status: DC

## 2023-09-25 MED ORDER — FLUTICASONE PROPIONATE 50 MCG/ACT NA SUSP
2.0000 | Freq: Every day | NASAL | 5 refills | Status: DC | PRN
Start: 2023-09-25 — End: 2024-03-24

## 2023-09-25 NOTE — Telephone Encounter (Signed)
Patient called back and stated it was not included on her discharge summary to continue Claritin, she is wondering if she is supposed to continue the medication. Please route back to [facility]. Thank you.

## 2023-09-25 NOTE — Telephone Encounter (Signed)
She can take Claritin if needed. Please remind her that this may make her sleepy. A better choice is the azelastine nasal spray as needed for a runny nose. Ill print and send a new AVS. Thank you

## 2023-09-25 NOTE — Telephone Encounter (Signed)
Called and left a voicemail asking for patient to return call to inform.  

## 2023-09-26 LAB — ALPHA-GAL PANEL
Allergen Lamb IgE: 9.21 kU/L — AB
Beef IgE: 27.3 kU/L — AB
IgE (Immunoglobulin E), Serum: 703 [IU]/mL — ABNORMAL HIGH (ref 6–495)
O215-IgE Alpha-Gal: 60.6 kU/L — AB
Pork IgE: 1.68 kU/L — AB

## 2023-09-29 NOTE — Progress Notes (Signed)
Can you please let this patient know that her alpha gal panel is higher that it was the last time we checked which was about 7 months ago. Please have her continue to avoid mammalian meats. She may continue to consume milk products as she has been tolerating these without adverse reaction. Thank you

## 2023-09-30 ENCOUNTER — Other Ambulatory Visit: Payer: Self-pay

## 2023-09-30 NOTE — Telephone Encounter (Signed)
Called and spoke to patient and informed her of the note from [facility]. Patient also requested to review lab results which we did as well. Patient verbalized understanding.

## 2023-11-07 ENCOUNTER — Telehealth: Payer: Self-pay

## 2023-11-07 NOTE — Telephone Encounter (Addendum)
Copied from CRM [id number]. Topic: General - Other >> Nov 07, 2023  9:33 AM Deaijah H wrote: Reason for CRM: Patient called in stating a nurse is supposed to make a home visit and would like the date and time   ---left a message for a return call for details --

## 2023-11-07 NOTE — Telephone Encounter (Signed)
Pt called back and said she had the nurse home visit figured out someone else had given her a call explaining

## 2023-11-14 ENCOUNTER — Ambulatory Visit (INDEPENDENT_AMBULATORY_CARE_PROVIDER_SITE_OTHER): Payer: Medicare Other

## 2023-11-14 VITALS — Ht 62.0 in | Wt 160.0 lb

## 2023-11-14 DIAGNOSIS — Z780 Asymptomatic menopausal state: Secondary | ICD-10-CM

## 2023-11-14 DIAGNOSIS — Z0000 Encounter for general adult medical examination without abnormal findings: Secondary | ICD-10-CM | POA: Diagnosis not present

## 2023-11-14 NOTE — Progress Notes (Signed)
 Please attest and cosign this visit due to patients primary care provider not being in the office at the time the visit was completed.   Because this visit was a virtual/telehealth visit,  certain criteria was not obtained, such a blood pressure, CBG if applicable, and timed get up and go. Any medications not marked as "taking" were not mentioned during the medication reconciliation part of the visit. Any vitals not documented were not able to be obtained due to this being a telehealth visit or patient was unable to self-report a recent blood pressure reading due to a lack of equipment at home via telehealth. Vitals that have been documented are verbally provided by the patient.   Subjective:   Valerie Thomas is a 77 y.o. female who presents for Medicare Annual (Subsequent) preventive examination.  Visit Complete: Virtual I connected with  [name] on 11/14/23 by a audio enabled telemedicine application and verified that I am speaking with the correct person using two identifiers.  Patient Location: Home  Provider Location: Home Office  I discussed the limitations of evaluation and management by telemedicine. The patient expressed understanding and agreed to proceed.  Vital Signs: Because this visit was a virtual/telehealth visit, some criteria may be missing or patient reported. Any vitals not documented were not able to be obtained and vitals that have been documented are patient reported.  Patient Medicare AWV questionnaire was completed by the patient on na; I have confirmed that all information answered by patient is correct and no changes since this date.  Cardiac Risk Factors include: advanced age (>65men, >79 women);diabetes mellitus;dyslipidemia;hypertension;sedentary lifestyle     Objective:    Today's Vitals   11/14/23 1503  Weight: 160 lb (72.6 kg)  Height: 5\' 2"  (1.575 m)   Body mass index is 29.26 kg/m.     11/14/2023    3:02 PM 05/28/2022    2:37 PM 06/05/2021     2:48 PM 06/22/2020    9:55 AM  Advanced Directives  Does Patient Have a Medical Advance Directive? No Yes No No  Type of Special educational needs teacher of [place];Living will    Copy of Healthcare Power of Attorney in Chart?  No - copy requested    Would patient like information on creating a medical advance directive? No - Patient declined  No - Patient declined Yes (MAU/Ambulatory/Procedural Areas - Information given)    Current Medications (verified) Outpatient Encounter Medications as of 11/14/2023  Medication Sig   ACCU-CHEK AVIVA PLUS test strip TESTS ONCE DAILY   Accu-Chek Softclix Lancets lancets TESTS ONCE DAILY   acetaminophen (TYLENOL) 500 MG tablet Take 1-2 tablets (500-1,000 mg total) by mouth every 6 (six) hours as needed.   albuterol (VENTOLIN HFA) 108 (90 Base) MCG/ACT inhaler USE 2 INHALATIONS BY MOUTH EVERY 6 HOURS AS NEEDED FOR WHEEZING  OR SHORTNESS OF BREATH   amLODipine (NORVASC) 5 MG tablet TAKE 1 TABLET BY MOUTH DAILY   azelastine (ASTELIN) 0.1 % nasal spray Place 2 sprays in each nostril once or twice a day as needed for a runny nose   Blood Glucose Monitoring Suppl (ACCU-CHEK AVIVA PLUS) w/Device KIT    Cod Liver Oil CAPS Take 1 capsule by mouth daily.   EPINEPHrine 0.3 mg/0.3 mL IJ SOAJ injection Inject 0.3 mg into the muscle as needed for anaphylaxis.   fluticasone (FLONASE) 50 MCG/ACT nasal spray Place 2 sprays into both nostrils daily as needed (stuffy nose).   gabapentin (NEURONTIN) 300 MG capsule Take  1 capsule by mouth twice daily   JANUVIA 100 MG tablet Take 1 tablet (100 mg total) by mouth daily.   levothyroxine (SYNTHROID) 75 MCG tablet TAKE 1 TABLET BY MOUTH DAILY   levothyroxine (SYNTHROID) 75 MCG tablet Take 1 tablet (75 mcg total) by mouth daily.   loratadine (CLARITIN) 10 MG tablet Take 1 tablet (10 mg total) by mouth every morning.   metFORMIN (GLUCOPHAGE) 500 MG tablet TAKE 1 TABLET BY MOUTH TWICE  DAILY WITH MEALS   montelukast (SINGULAIR)  10 MG tablet TAKE 1 TABLET BY MOUTH DAILY   potassium chloride SA (KLOR-CON M) 20 MEQ tablet Take 1 tablet (20 mEq total) by mouth 2 (two) times daily.   simvastatin (ZOCOR) 40 MG tablet TAKE 1 TABLET BY MOUTH DAILY   traMADol (ULTRAM) 50 MG tablet Take 1 tablet (50 mg total) by mouth every 12 (twelve) hours as needed for severe pain or moderate pain.   valsartan-hydrochlorothiazide (DIOVAN-HCT) 160-25 MG tablet TAKE 1 TABLET BY MOUTH DAILY   chlorhexidine (PERIDEX) 0.12 % solution 15 mLs 2 (two) times daily. (Patient not taking: Reported on 11/14/2023)   No facility-administered encounter medications on file as of 11/14/2023.    Allergies (verified) Alpha-gal   History: Past Medical History:  Diagnosis Date   Acid reflux    Asthma    Diabetes mellitus without complication (HCC)    Eczema    Hypercholesteremia    Hypertension    Thyroid disease    Past Surgical History:  Procedure Laterality Date   ABDOMINAL HYSTERECTOMY     HAND SURGERY     THYROID SURGERY     torn ligament     neck surgery    UTERINE FIBROID SURGERY     Family History  Problem Relation Age of Onset   Asthma Sister    Allergic rhinitis Neg Hx    Urticaria Neg Hx    Eczema Neg Hx    Social History   Socioeconomic History   Marital status: Widowed    Spouse name: Not on file   Number of children: Not on file   Years of education: Not on file   Highest education level: Not on file  Occupational History   Not on file  Tobacco Use   Smoking status: Never    Passive exposure: Past   Smokeless tobacco: Never  Substance and Sexual Activity   Alcohol use: No   Drug use: No   Sexual activity: Never  Other Topics Concern   Not on file  Social History Narrative   Not on file   Social Drivers of Health   Financial Resource Strain: High Risk (11/14/2023)   Overall Financial Resource Strain (CARDIA)    Difficulty of Paying Living Expenses: Hard  Food Insecurity: No Food Insecurity (11/14/2023)    Hunger Vital Sign    Worried About Running Out of Food in the Last Year: Never true    Ran Out of Food in the Last Year: Never true  Transportation Needs: No Transportation Needs (11/14/2023)   PRAPARE - Administrator, Civil Service (Medical): No    Lack of Transportation (Non-Medical): No  Physical Activity: Insufficiently Active (11/14/2023)   Exercise Vital Sign    Days of Exercise per Week: 3 days    Minutes of Exercise per Session: 30 min  Stress: No Stress Concern Present (11/14/2023)   [employer] of Occupational Health - Occupational Stress Questionnaire    Feeling of Stress : Not at all  Social Connections: Socially Integrated (11/14/2023)   Social Connection and Isolation Panel [NHANES]    Frequency of Communication with Friends and Family: More than three times a week    Frequency of Social Gatherings with Friends and Family: More than three times a week    Attends Religious Services: More than 4 times per year    Active Member of [employer] or Organizations: Yes    Attends Engineer, structural: More than 4 times per year    Marital Status: Married    Tobacco Counseling Counseling given: Not Answered   Clinical Intake:  Pre-visit preparation completed: Yes  Pain : No/denies pain     BMI - recorded: 29.96 Nutritional Status: BMI 25 -29 Overweight Nutritional Risks: None Diabetes: Yes CBG done?: No (telehealth visit.) Did pt. bring in CBG monitor from home?: No  How often do you need to have someone help you when you read instructions, pamphlets, or other written materials from your doctor or pharmacy?: 1 - Never  Interpreter Needed?: No  Information entered by ::  Lavelle Berland,CMA   Activities of Daily Living    11/14/2023    3:12 PM  In your present state of health, do you have any difficulty performing the following activities:  Hearing? 0  Vision? 0  Difficulty concentrating or making decisions? 0  Walking or climbing stairs?  0  Dressing or bathing? 0  Doing errands, shopping? 1  Preparing Food and eating ? N  Using the Toilet? N  In the past six months, have you accidently leaked urine? N  Do you have problems with loss of bowel control? N  Managing your Medications? N  Managing your Finances? N  Housekeeping or managing your Housekeeping? Y    Patient Care Team: Tommie Sams, DO as PCP - General (Family Medicine)  Indicate any recent Medical Services you may have received from other than [facility] providers in the past year (date may be approximate).     Assessment:   This is a routine wellness examination for Frederica.  Hearing/Vision screen Hearing Screening - Comments:: Patient denies any hearing difficulties.   Vision Screening - Comments:: Wears rx glasses - up to date with routine eye exams with Dr. Daisy Lazar   Goals Addressed             This Visit's Progress    Patient Stated       I'd like to lose weight and be able to maintain it        Depression Screen    11/14/2023    3:06 PM 03/12/2023    4:20 PM 01/22/2023   10:04 AM 10/21/2022    2:24 PM 10/07/2022   10:46 AM 07/04/2022    9:56 AM 05/28/2022    2:31 PM  PHQ 2/9 Scores  PHQ - 2 Score 0 0 0 1 2 0 0  PHQ- 9 Score 0 5 0 4 5      Fall Risk    11/14/2023    3:12 PM 03/12/2023    4:20 PM 01/22/2023   10:03 AM 10/21/2022    2:23 PM 10/07/2022   10:47 AM  Fall Risk   Falls in the past year? 1 0 0 0 1  Number falls in past yr: 1 0 0 0 1  Injury with Fall? 1 0 0 0 0  Risk for fall due to : History of fall(s);Impaired balance/gait;Orthopedic patient  No Fall Risks  History of fall(s)  Follow up Education provided;Falls prevention  discussed  Falls evaluation completed  Falls evaluation completed    MEDICARE RISK AT HOME: Medicare Risk at Home Any stairs in or around the home?: Yes If so, are there any without handrails?: No Home free of loose throw rugs in walkways, pet beds, electrical cords, etc?: Yes Adequate lighting  in your home to reduce risk of falls?: Yes Life alert?: Yes Use of a cane, walker or w/c?: Yes Grab bars in the bathroom?: Yes Shower chair or bench in shower?: Yes Elevated toilet seat or a handicapped toilet?: Yes  TIMED UP AND GO:  Was the test performed?  No    Cognitive Function:        11/14/2023    3:06 PM 05/28/2022    2:41 PM  6CIT Screen  What Year? 0 points 0 points  What month? 0 points 0 points  What time? 0 points 0 points  Count back from 20 0 points 0 points  Months in reverse 0 points 0 points  Repeat phrase 0 points 4 points  Total Score 0 points 4 points    Immunizations Immunization History  Administered Date(s) Administered   Fluad Quad(high Dose 65+) 09/03/2021   Influenza, High Dose Seasonal PF 08/16/2019, 09/03/2022, 09/20/2023   Influenza-Unspecified 09/01/2018   Moderna Covid-19 Vaccine Bivalent Booster 49yrs & up 09/28/2021   Moderna SARS-COV2 Booster Vaccination 10/28/2022   Moderna Sars-Covid-2 Vaccination 12/24/2019, 01/19/2020, 09/26/2020, 06/19/2021   PNEUMOCOCCAL CONJUGATE-20 09/03/2021   RSV,unspecified 10/28/2022   Tdap 03/06/2013   Zoster Recombinant(Shingrix) 04/10/2022, 07/22/2022    TDAP status: Due, Education has been provided regarding the importance of this vaccine. Advised may receive this vaccine at local pharmacy or Health Dept. Aware to provide a copy of the vaccination record if obtained from local pharmacy or Health Dept. Verbalized acceptance and understanding.  Flu Vaccine status: Up to date  Pneumococcal vaccine status: Up to date  Covid-19 vaccine status: Information provided on how to obtain vaccines.   Qualifies for Shingles Vaccine? No   Zostavax completed No   Shingrix Completed?: Yes  Screening Tests Health Maintenance  Topic Date Due   DTaP/Tdap/Td (2 - Td or Tdap) 03/07/2023   HEMOGLOBIN A1C  04/07/2023   Medicare Annual Wellness (AWV)  05/29/2023   COVID-19 Vaccine (7 - 2024-25 season) 08/03/2023    Diabetic kidney evaluation - eGFR measurement  10/08/2023   FOOT EXAM  10/22/2023   OPHTHALMOLOGY EXAM  12/20/2023   Diabetic kidney evaluation - Urine ACR  01/23/2024   Pneumonia Vaccine 17+ Years old  Completed   INFLUENZA VACCINE  Completed   DEXA SCAN  Completed   Zoster Vaccines- Shingrix  Completed   HPV VACCINES  Aged Out   Colonoscopy  Discontinued   Hepatitis C Screening  Discontinued    Health Maintenance  Health Maintenance Due  Topic Date Due   DTaP/Tdap/Td (2 - Td or Tdap) 03/07/2023   HEMOGLOBIN A1C  04/07/2023   Medicare Annual Wellness (AWV)  05/29/2023   COVID-19 Vaccine (7 - 2024-25 season) 08/03/2023   Diabetic kidney evaluation - eGFR measurement  10/08/2023   FOOT EXAM  10/22/2023    Colorectal cancer screening: No longer required.   Mammogram status: Completed 03/31/2023. Repeat every year  Bone Density status: Ordered 11/14/2023. Pt provided with contact info and advised to call to schedule appt.  Lung Cancer Screening: (Low Dose CT Chest recommended if Age 42-80 years, 20 pack-year currently smoking OR have quit w/in 15years.) does not qualify.   Lung Cancer Screening  Referral: na  Additional Screening:  Hepatitis C Screening: does not qualify; Completed   Vision Screening: Recommended annual ophthalmology exams for early detection of glaucoma and other disorders of the eye. Is the patient up to date with their annual eye exam?  Yes  Who is the provider or what is the name of the office in which the patient attends annual eye exams? [facility] If pt is not established with a provider, would they like to be referred to a provider to establish care? No .   Dental Screening: Recommended annual dental exams for proper oral hygiene  Diabetic Foot Exam: Diabetic Foot Exam: Overdue, Pt has been advised about the importance in completing this exam. Pt is scheduled for diabetic foot exam on 11/19/2023.  Community Resource Referral / Chronic Care  Management: CRR required this visit?  No   CCM required this visit?  No     Plan:     I have personally reviewed and noted the following in the patient's chart:   Medical and social history Use of alcohol, tobacco or illicit drugs  Current medications and supplements including opioid prescriptions. Patient is not currently taking opioid prescriptions. Functional ability and status Nutritional status Physical activity Advanced directives List of other physicians Hospitalizations, surgeries, and ER visits in previous 12 months Vitals Screenings to include cognitive, depression, and falls Referrals and appointments  In addition, I have reviewed and discussed with patient certain preventive protocols, quality metrics, and best practice recommendations. A written personalized care plan for preventive services as well as general preventive health recommendations were provided to patient.     Jordan Hawks Bani Gianfrancesco, CMA   11/14/2023   After Visit Summary: (Mail) Due to this being a telephonic visit, the after visit summary with patients personalized plan was offered to patient via mail   Nurse Notes: see routing comment

## 2023-11-14 NOTE — Patient Instructions (Signed)
Ms. Stanbro , Thank you for taking time to come for your Medicare Wellness Visit. I appreciate your ongoing commitment to your health goals. Please review the following plan we discussed and let me know if I can assist you in the future.   Referrals/Orders/Follow-Ups/Clinician Recommendations:  Next Medicare Annual Wellness Visit: November 19, 2024 at 1:40p virtual visit  You have an order for:  []   2D Mammogram  []   3D Mammogram  [x]   Bone Density   []   Lung Cancer Screening  Please call for appointment:   [facility] [address] [place], [place] [address] [phone]  Make sure to wear two-piece clothing.  No lotions powders or deodorants the day of the appointment Make sure to bring picture ID and insurance card.  Bring list of medications you are currently taking including any supplements.   Exercises to do While Sitting  Exercises that you do while sitting (chair exercises) can give you many of the same benefits as full exercise. Benefits include strengthening your heart, burning calories, and keeping muscles and joints healthy. Exercise can also improve your mood and help with depression and anxiety. You may benefit from chair exercises if you are unable to do standing exercises due to: Diabetic foot pain. Obesity. Illness. Arthritis. Recovery from surgery or injury. Breathing problems. Balance problems. Another type of disability. Before starting chair exercises, check with your health care provider or a physical therapist to find out how much exercise you can tolerate and which exercises are safe for you. If your health care provider approves: Start out slowly and build up over time. Aim to work up to about 10-20 minutes for each exercise session. Make exercise part of your daily routine. Drink water when you exercise. Do not wait until you are thirsty. Drink every 10-15 minutes. Stop exercising right away if you have pain,  nausea, shortness of breath, or dizziness. If you are exercising in a wheelchair, make sure to lock the wheels. Ask your health care provider whether you can do tai chi or yoga. Many positions in these mind-body exercises can be modified to do while seated. Warm-up Before starting other exercises: Sit up as straight as you can. Have your knees bent at 90 degrees, which is the shape of the capital letter "L." Keep your feet flat on the floor. Sit at the front edge of your chair, if you can. Pull in (tighten) the muscles in your abdomen and stretch your spine and neck as straight as you can. Hold this position for a few minutes. Breathe in and out evenly. Try to concentrate on your breathing, and relax your mind. Stretching Exercise A: Arm stretch Hold your arms out straight in front of your body. Bend your hands at the wrist with your fingers pointing up, as if signaling someone to stop. Notice the slight tension in your forearms as you hold the position. Keeping your arms out and your hands bent, rotate your hands outward as far as you can and hold this stretch. Aim to have your thumbs pointing up and your pinkie fingers pointing down. Slowly repeat arm stretches for one minute as tolerated. Exercise B: Leg stretch If you can move your legs, try to "draw" letters on the floor with the toes of your foot. Write your name with one foot. Write your name with the toes of your other foot. Slowly repeat the movements for one minute as tolerated. Exercise C: Reach for the sky Reach your  hands as far over your head as you can to stretch your spine. Move your hands and arms as if you are climbing a rope. Slowly repeat the movements for one minute as tolerated. Range of motion exercises Exercise A: Shoulder roll Let your arms hang loosely at your sides. Lift just your shoulders up toward your ears, then let them relax back down. When your shoulders feel loose, rotate your shoulders in backward and  forward circles. Do shoulder rolls slowly for one minute as tolerated. Exercise B: March in place As if you are marching, pump your arms and lift your legs up and down. Lift your knees as high as you can. If you are unable to lift your knees, just pump your arms and move your ankles and feet up and down. March in place for one minute as tolerated. Exercise C: Seated jumping jacks Let your arms hang down straight. Keeping your arms straight, lift them up over your head. Aim to point your fingers to the ceiling. While you lift your arms, straighten your legs and slide your heels along the floor to your sides, as wide as you can. As you bring your arms back down to your sides, slide your legs back together. If you are unable to use your legs, just move your arms. Slowly repeat seated jumping jacks for one minute as tolerated. Strengthening exercises Exercise A: Shoulder squeeze Hold your arms straight out from your body to your sides, with your elbows bent and your fists pointed at the ceiling. Keeping your arms in the bent position, move them forward so your elbows and forearms meet in front of your face. Open your arms back out as wide as you can with your elbows still bent, until you feel your shoulder blades squeezing together. Hold for 5 seconds. Slowly repeat the movements forward and backward for one minute as tolerated. Contact a health care provider if: You have to stop exercising due to any of the following: Pain. Nausea. Shortness of breath. Dizziness. Fatigue. You have significant pain or soreness after exercising. Get help right away if: You have chest pain. You have difficulty breathing. These symptoms may represent a serious problem that is an emergency. Do not wait to see if the symptoms will go away. Get medical help right away. Call your local emergency services (911 in the [place] Do not drive yourself to the hospital. Summary Exercises that you do while sitting (chair  exercises) can strengthen your heart, burn calories, and keep muscles and joints healthy. You may benefit from chair exercises if you are unable to do standing exercises due to diabetic foot pain, obesity, recovery from surgery or injury, or other conditions. Before starting chair exercises, check with your health care provider or a physical therapist to find out how much exercise you can tolerate and which exercises are safe for you. This information is not intended to replace advice given to you by your health care provider. Make sure you discuss any questions you have with your health care provider. Document Revised: 01/14/2021 Document Reviewed: 01/14/2021 Elsevier Patient Education  2024 [employer].   This is a list of the screening recommended for you and due dates:  Health Maintenance  Topic Date Due   DEXA scan (bone density measurement)  05/29/2006   DTaP/Tdap/Td vaccine (2 - Td or Tdap) 03/07/2023   Hemoglobin A1C  04/07/2023   COVID-19 Vaccine (7 - 2024-25 season) 08/03/2023   Yearly kidney function blood test for diabetes  10/08/2023  Complete foot exam   10/22/2023   Eye exam for diabetics  12/20/2023   Yearly kidney health urinalysis for diabetes  01/23/2024   Mammogram  03/30/2024   Medicare Annual Wellness Visit  11/13/2024   Pneumonia Vaccine  Completed   Flu Shot  Completed   Zoster (Shingles) Vaccine  Completed   HPV Vaccine  Aged Out   Colon Cancer Screening  Discontinued   Hepatitis C Screening  Discontinued    Advanced directives: (ACP Link)Information on Advanced Care Planning can be found at [facility] Advance Health Care Directives Advance Health Care Directives ([email or web address]   Next Medicare Annual Wellness Visit scheduled for next year: Yes  Preventive Care 65 Years and Older, Female Preventive care refers to lifestyle choices and visits with your health care provider that can promote health and wellness. Preventive care visits are  also called wellness exams. What can I expect for my preventive care visit? Counseling Your health care provider may ask you questions about your: Medical history, including: Past medical problems. Family medical history. Pregnancy and menstrual history. History of falls. Current health, including: Memory and ability to understand (cognition). Emotional well-being. Home life and relationship well-being. Sexual activity and sexual health. Lifestyle, including: Alcohol, nicotine or tobacco, and drug use. Access to firearms. Diet, exercise, and sleep habits. Work and work Astronomer. Sunscreen use. Safety issues such as seatbelt and bike helmet use. Physical exam Your health care provider will check your: Height and weight. These may be used to calculate your BMI (body mass index). BMI is a measurement that tells if you are at a healthy weight. Waist circumference. This measures the distance around your waistline. This measurement also tells if you are at a healthy weight and may help predict your risk of certain diseases, such as type 2 diabetes and high blood pressure. Heart rate and blood pressure. Body temperature. Skin for abnormal spots. What immunizations do I need?  Vaccines are usually given at various ages, according to a schedule. Your health care provider will recommend vaccines for you based on your age, medical history, and lifestyle or other factors, such as travel or where you work. What tests do I need? Screening Your health care provider may recommend screening tests for certain conditions. This may include: Lipid and cholesterol levels. Hepatitis C test. Hepatitis B test. HIV (human immunodeficiency virus) test. STI (sexually transmitted infection) testing, if you are at risk. Lung cancer screening. Colorectal cancer screening. Diabetes screening. This is done by checking your blood sugar (glucose) after you have not eaten for a while (fasting). Mammogram.  Talk with your health care provider about how often you should have regular mammograms. BRCA-related cancer screening. This may be done if you have a family history of breast, ovarian, tubal, or peritoneal cancers. Bone density scan. This is done to screen for osteoporosis. Talk with your health care provider about your test results, treatment options, and if necessary, the need for more tests. Follow these instructions at home: Eating and drinking  Eat a diet that includes fresh fruits and vegetables, whole grains, lean protein, and low-fat dairy products. Limit your intake of foods with high amounts of sugar, saturated fats, and salt. Take vitamin and mineral supplements as recommended by your health care provider. Do not drink alcohol if your health care provider tells you not to drink. If you drink alcohol: Limit how much you have to 0-1 drink a day. Know how much alcohol is in your drink. In the [place]  one drink equals one 12 oz bottle of beer (355 mL), one 5 oz glass of wine (148 mL), or one 1 oz glass of hard liquor (44 mL). Lifestyle Brush your teeth every morning and night with fluoride toothpaste. Floss one time each day. Exercise for at least 30 minutes 5 or more days each week. Do not use any products that contain nicotine or tobacco. These products include cigarettes, chewing tobacco, and vaping devices, such as e-cigarettes. If you need help quitting, ask your health care provider. Do not use drugs. If you are sexually active, practice safe sex. Use a condom or other form of protection in order to prevent STIs. Take aspirin only as told by your health care provider. Make sure that you understand how much to take and what form to take. Work with your health care provider to find out whether it is safe and beneficial for you to take aspirin daily. Ask your health care provider if you need to take a cholesterol-lowering medicine (statin). Find healthy ways to manage stress, such  as: Meditation, yoga, or listening to music. Journaling. Talking to a trusted person. Spending time with friends and family. Minimize exposure to UV radiation to reduce your risk of skin cancer. Safety Always wear your seat belt while driving or riding in a vehicle. Do not drive: If you have been drinking alcohol. Do not ride with someone who has been drinking. When you are tired or distracted. While texting. If you have been using any mind-altering substances or drugs. Wear a helmet and other protective equipment during sports activities. If you have firearms in your house, make sure you follow all gun safety procedures. What's next? Visit your health care provider once a year for an annual wellness visit. Ask your health care provider how often you should have your eyes and teeth checked. Stay up to date on all vaccines. This information is not intended to replace advice given to you by your health care provider. Make sure you discuss any questions you have with your health care provider. Document Revised: 05/16/2021 Document Reviewed: 05/16/2021 Elsevier Patient Education  2024 [employer]. Understanding Your Risk for Falls Millions of people have serious injuries from falls each year. It is important to understand your risk of falling. Talk with your health care provider about your risk and what you can do to lower it. If you do have a serious fall, make sure to tell your provider. Falling once raises your risk of falling again. How can falls affect me? Serious injuries from falls are common. These include: Broken bones, such as hip fractures. Head injuries, such as traumatic brain injuries (TBI) or concussions. A fear of falling can cause you to avoid activities and stay at home. This can make your muscles weaker and raise your risk for a fall. What can increase my risk? There are a number of risk factors that increase your risk for falling. The more risk factors you have, the  higher your risk of falling. Serious injuries from a fall happen most often to people who are older than 78 years old. Teenagers and young adults ages 52-29 are also at higher risk. Common risk factors include: Weakness in the lower body. Being generally weak or confused due to long-term (chronic) illness. Dizziness or balance problems. Poor vision. Medicines that cause dizziness or drowsiness. These may include: Medicines for your blood pressure, heart, anxiety, insomnia, or swelling (edema). Pain medicines. Muscle relaxants. Other risk factors include: Drinking alcohol. Having had a  fall in the past. Having foot pain or wearing improper footwear. Working at a dangerous job. Having any of the following in your home: Tripping hazards, such as floor clutter or loose rugs. Poor lighting. Pets. Having dementia or memory loss. What actions can I take to lower my risk of falling?     Physical activity Stay physically fit. Do strength and balance exercises. Consider taking a regular class to build strength and balance. Yoga and tai chi are good options. Vision Have your eyes checked every year and your prescription for glasses or contacts updated as needed. Shoes and walking aids Wear non-skid shoes. Wear shoes that have rubber soles and low heels. Do not wear high heels. Do not walk around the house in socks or slippers. Use a cane or walker as told by your provider. Home safety Attach secure railings on both sides of your stairs. Install grab bars for your bathtub, shower, and toilet. Use a non-skid mat in your bathtub or shower. Attach bath mats securely with double-sided, non-slip rug tape. Use good lighting in all rooms. Keep a flashlight near your bed. Make sure there is a clear path from your bed to the bathroom. Use night-lights. Do not use throw rugs. Make sure all carpeting is taped or tacked down securely. Remove all clutter from walkways and stairways, including  extension cords. Repair uneven or broken steps and floors. Avoid walking on icy or slippery surfaces. Walk on the grass instead of on icy or slick sidewalks. Use ice melter to get rid of ice on walkways in the winter. Use a cordless phone. Questions to ask your health care provider Can you help me check my risk for a fall? Do any of my medicines make me more likely to fall? Should I take a vitamin D supplement? What exercises can I do to improve my strength and balance? Should I make an appointment to have my vision checked? Do I need a bone density test to check for weak bones (osteoporosis)? Would it help to use a cane or a walker? Where to find more information Centers for Disease Control and Prevention, [employer]AD[employer]: TonerPromos.no Community-Based Fall Prevention Programs: TonerPromos.no [employer] on Aging: BaseRingTones.pl Contact a health care provider if: You fall at home. You are afraid of falling at home. You feel weak, drowsy, or dizzy. This information is not intended to replace advice given to you by your health care provider. Make sure you discuss any questions you have with your health care provider. Document Revised: 07/22/2022 Document Reviewed: 07/22/2022 Elsevier Patient Education  2024 [employer].

## 2023-11-19 ENCOUNTER — Ambulatory Visit (INDEPENDENT_AMBULATORY_CARE_PROVIDER_SITE_OTHER): Payer: Medicare Other | Admitting: Family Medicine

## 2023-11-19 VITALS — BP 120/58 | HR 95 | Temp 97.2°F | Ht 62.0 in | Wt 165.0 lb

## 2023-11-19 DIAGNOSIS — E1169 Type 2 diabetes mellitus with other specified complication: Secondary | ICD-10-CM

## 2023-11-19 DIAGNOSIS — E119 Type 2 diabetes mellitus without complications: Secondary | ICD-10-CM | POA: Diagnosis not present

## 2023-11-19 DIAGNOSIS — J209 Acute bronchitis, unspecified: Secondary | ICD-10-CM | POA: Diagnosis not present

## 2023-11-19 DIAGNOSIS — Z780 Asymptomatic menopausal state: Secondary | ICD-10-CM

## 2023-11-19 DIAGNOSIS — Z130 Encounter for screening for diseases of the blood and blood-forming organs and certain disorders involving the immune mechanism: Secondary | ICD-10-CM | POA: Diagnosis not present

## 2023-11-19 DIAGNOSIS — J4520 Mild intermittent asthma, uncomplicated: Secondary | ICD-10-CM | POA: Diagnosis not present

## 2023-11-19 DIAGNOSIS — E039 Hypothyroidism, unspecified: Secondary | ICD-10-CM | POA: Diagnosis not present

## 2023-11-19 DIAGNOSIS — Z7984 Long term (current) use of oral hypoglycemic drugs: Secondary | ICD-10-CM

## 2023-11-19 DIAGNOSIS — E785 Hyperlipidemia, unspecified: Secondary | ICD-10-CM | POA: Diagnosis not present

## 2023-11-19 DIAGNOSIS — I10 Essential (primary) hypertension: Secondary | ICD-10-CM | POA: Diagnosis not present

## 2023-11-19 MED ORDER — PROMETHAZINE-DM 6.25-15 MG/5ML PO SYRP: 5.0000 mL | ORAL_SOLUTION | Freq: Four times a day (QID) | ORAL | 0 refills | Status: DC | PRN

## 2023-11-19 MED ORDER — ALBUTEROL SULFATE HFA 108 (90 BASE) MCG/ACT IN AERS: INHALATION_SPRAY | RESPIRATORY_TRACT | 0 refills | Status: DC

## 2023-11-19 MED ORDER — AMOXICILLIN-POT CLAVULANATE 875-125 MG PO TABS
1.0000 | ORAL_TABLET | Freq: Two times a day (BID) | ORAL | 0 refills | Status: DC
Start: 2023-11-19 — End: 2024-01-07

## 2023-11-19 MED ORDER — AMOXICILLIN-POT CLAVULANATE 875-125 MG PO TABS: 1.0000 | ORAL_TABLET | Freq: Two times a day (BID) | ORAL | 0 refills | Status: DC

## 2023-11-19 NOTE — Patient Instructions (Addendum)
Labs today.   Stay active.   Medications sent for the Bronchitis.   Follow up in 3 months.  Take care  Dr. Adriana Simas

## 2023-11-20 DIAGNOSIS — J209 Acute bronchitis, unspecified: Secondary | ICD-10-CM | POA: Insufficient documentation

## 2023-11-20 MED ORDER — LEVOTHYROXINE SODIUM 50 MCG PO TABS
50.0000 ug | ORAL_TABLET | Freq: Every day | ORAL | 1 refills | Status: DC
Start: 2023-11-20 — End: 2023-11-21

## 2023-11-20 NOTE — Assessment & Plan Note (Signed)
Lipid panel returned with LDL at goal.  Continue simvastatin.

## 2023-11-20 NOTE — Assessment & Plan Note (Signed)
Stable.  Continue current medications.

## 2023-11-20 NOTE — Assessment & Plan Note (Signed)
Awaiting A1c results.  Continue Januvia and metformin.

## 2023-11-20 NOTE — Assessment & Plan Note (Signed)
THS is suppressed.  Decreasing levothyroxine to 50 mcg daily.

## 2023-11-20 NOTE — Progress Notes (Signed)
Subjective:  Patient ID: Valerie Thomas, female    DOB: [birth year]  Age: 77 y.o. MRN: [id number]  CC:   Chief Complaint  Patient presents with   Diabetes   Hypertension    HPI:  77 year old female presents for follow-up.  Hypertension well-controlled on amlodipine, and valsartan/HCTZ.  Patient reports ongoing cough which is productive.  Has been going on for the past 2 weeks.  No fever.  Cough interferes with sleep at times.  She has used Robitussin without relief.  No fever.  Patient needs TSH today to reassess.  She is currently on 75 mcg of Synthroid.  Diabetes has been at goal.  She is on metformin and Januvia.  Lipids have been stable on simvastatin.  Needs labs today.  Patient Active Problem List   Diagnosis Date Noted   Acute bronchitis 11/20/2023   Mild intermittent asthma without complication 09/25/2023   Anaphylactic shock due to adverse food reaction 09/25/2023   Allergy to alpha-gal 09/25/2023   Seasonal allergic rhinitis due to pollen 09/25/2023   Mass of right thigh 07/15/2023   Knee pain 01/23/2023   History of urticaria 10/22/2022   Hyperlipidemia associated with type 2 diabetes mellitus (HCC) 10/22/2022   Hypothyroidism 10/22/2022   Chronic urticaria 04/30/2022   Obesity 02/13/2022   Gastroesophageal reflux disease 02/13/2022   Diabetes mellitus without complication (HCC) 02/13/2022   Hypertension 02/13/2022   Vertigo 02/06/2022    Social Hx   Social History   Socioeconomic History   Marital status: Widowed    Spouse name: Not on file   Number of children: Not on file   Years of education: Not on file   Highest education level: Not on file  Occupational History   Not on file  Tobacco Use   Smoking status: Never    Passive exposure: Past   Smokeless tobacco: Never  Substance and Sexual Activity   Alcohol use: No   Drug use: No   Sexual activity: Never  Other Topics Concern   Not on file  Social History Narrative   Not on file    Social Drivers of Health   Financial Resource Strain: High Risk (11/14/2023)   Overall Financial Resource Strain (CARDIA)    Difficulty of Paying Living Expenses: Hard  Food Insecurity: No Food Insecurity (11/14/2023)   Hunger Vital Sign    Worried About Running Out of Food in the Last Year: Never true    Ran Out of Food in the Last Year: Never true  Transportation Needs: No Transportation Needs (11/14/2023)   PRAPARE - Administrator, Civil Service (Medical): No    Lack of Transportation (Non-Medical): No  Physical Activity: Insufficiently Active (11/14/2023)   Exercise Vital Sign    Days of Exercise per Week: 3 days    Minutes of Exercise per Session: 30 min  Stress: No Stress Concern Present (11/14/2023)   [employer] of Occupational Health - Occupational Stress Questionnaire    Feeling of Stress : Not at all  Social Connections: Socially Integrated (11/14/2023)   Social Connection and Isolation Panel [NHANES]    Frequency of Communication with Friends and Family: More than three times a week    Frequency of Social Gatherings with Friends and Family: More than three times a week    Attends Religious Services: More than 4 times per year    Active Member of [employer] or Organizations: Yes    Attends Banker Meetings: More than 4 times per year  Marital Status: Married    Review of Systems Per HPI  Objective:  BP (!) 120/58   Pulse 95   Temp (!) 97.2 F (36.2 C)   Ht 5\' 2"  (1.575 m)   Wt 165 lb (74.8 kg)   SpO2 96%   BMI 30.18 kg/m      11/19/2023   11:21 AM 11/14/2023    3:03 PM 09/24/2023    2:30 PM  BP/Weight  Systolic BP 120 -- 106  Diastolic BP 58 -- 64  Wt. (Lbs) 165 160 168.13  BMI 30.18 kg/m2 29.26 kg/m2 33.39 kg/m2    Physical Exam Vitals and nursing note reviewed.  Constitutional:      General: She is not in acute distress.    Appearance: Normal appearance.  HENT:     Head: Normocephalic and atraumatic.  Eyes:      General:        Right eye: No discharge.        Left eye: No discharge.     Conjunctiva/sclera: Conjunctivae normal.  Cardiovascular:     Rate and Rhythm: Normal rate and regular rhythm.  Pulmonary:     Effort: Pulmonary effort is normal.     Breath sounds: Normal breath sounds. No wheezing, rhonchi or rales.  Neurological:     Mental Status: She is alert.     Lab Results  Component Value Date   WBC WILL FOLLOW 11/19/2023   HGB WILL FOLLOW 11/19/2023   HCT WILL FOLLOW 11/19/2023   PLT WILL FOLLOW 11/19/2023   GLUCOSE 91 11/19/2023   CHOL 118 11/19/2023   TRIG 93 11/19/2023   HDL 50 11/19/2023   LDLCALC 50 11/19/2023   ALT 6 11/19/2023   AST 18 11/19/2023   NA 142 11/19/2023   K 4.3 11/19/2023   CL 101 11/19/2023   CREATININE 0.61 11/19/2023   BUN 7 (L) 11/19/2023   CO2 25 11/19/2023   TSH 0.145 (L) 11/19/2023   HGBA1C WILL FOLLOW 11/19/2023     Assessment & Plan:   Problem List Items Addressed This Visit       Cardiovascular and Mediastinum   Hypertension - Primary   Stable.  Continue current medications.        Respiratory   Mild intermittent asthma without complication   Relevant Medications   albuterol (VENTOLIN HFA) 108 (90 Base) MCG/ACT inhaler   Acute bronchitis   Treating with Augmentin.  Promethazine DM for cough.        Endocrine   Diabetes mellitus without complication (HCC)   Awaiting A1c results.  Continue Januvia and metformin.      Relevant Orders   CMP14+EGFR (Completed)   Hemoglobin A1c (Completed)   Microalbumin / creatinine urine ratio (Completed)   Hyperlipidemia associated with type 2 diabetes mellitus (HCC)   Lipid panel returned with LDL at goal.  Continue simvastatin.      Relevant Orders   Lipid panel (Completed)   Hypothyroidism   THS is suppressed.  Decreasing levothyroxine to 50 mcg daily.      Relevant Medications   levothyroxine (SYNTHROID) 50 MCG tablet   Other Relevant Orders   TSH (Completed)   Other  Visit Diagnoses       Screening for deficiency anemia       Relevant Orders   CBC (Completed)     Post-menopausal       Relevant Orders   DG Bone Density       Meds ordered this encounter  Medications  albuterol (VENTOLIN HFA) 108 (90 Base) MCG/ACT inhaler    Sig: USE 2 INHALATIONS BY MOUTH EVERY 6 HOURS AS NEEDED FOR WHEEZING  OR SHORTNESS OF BREATH    Dispense:  18 g    Refill:  0   DISCONTD: amoxicillin-clavulanate (AUGMENTIN) 875-125 MG tablet    Sig: Take 1 tablet by mouth 2 (two) times daily.    Dispense:  14 tablet    Refill:  0   DISCONTD: promethazine-dextromethorphan (PROMETHAZINE-DM) 6.25-15 MG/5ML syrup    Sig: Take 5 mLs by mouth 4 (four) times daily as needed for cough.    Dispense:  118 mL    Refill:  0   amoxicillin-clavulanate (AUGMENTIN) 875-125 MG tablet    Sig: Take 1 tablet by mouth 2 (two) times daily.    Dispense:  14 tablet    Refill:  0   promethazine-dextromethorphan (PROMETHAZINE-DM) 6.25-15 MG/5ML syrup    Sig: Take 5 mLs by mouth 4 (four) times daily as needed for cough.    Dispense:  118 mL    Refill:  0   levothyroxine (SYNTHROID) 50 MCG tablet    Sig: Take 1 tablet (50 mcg total) by mouth daily.    Dispense:  90 tablet    Refill:  1    Follow-up:  3 months  Deborra Phegley Adriana Simas DO [facility]

## 2023-11-20 NOTE — Assessment & Plan Note (Signed)
Treating with Augmentin.  Promethazine DM for cough. ?

## 2023-11-21 ENCOUNTER — Other Ambulatory Visit: Payer: Self-pay | Admitting: Family Medicine

## 2023-11-21 LAB — CBC
Hematocrit: 39.8 % (ref 34.0–46.6)
Hemoglobin: 12.3 g/dL (ref 11.1–15.9)
MCH: 27.6 pg (ref 26.6–33.0)
MCHC: 30.9 g/dL — ABNORMAL LOW (ref 31.5–35.7)
MCV: 89 fL (ref 79–97)
Platelets: 98 10*3/uL — CL (ref 150–450)
RBC: 4.46 x10E6/uL (ref 3.77–5.28)
RDW: 14.4 % (ref 11.7–15.4)
WBC: 7.4 10*3/uL (ref 3.4–10.8)

## 2023-11-21 LAB — CMP14+EGFR
ALT: 6 [IU]/L (ref 0–32)
AST: 18 [IU]/L (ref 0–40)
Albumin: 4.3 g/dL (ref 3.8–4.8)
Alkaline Phosphatase: 85 [IU]/L (ref 44–121)
BUN/Creatinine Ratio: 11 — ABNORMAL LOW (ref 12–28)
BUN: 7 mg/dL — ABNORMAL LOW (ref 8–27)
Bilirubin Total: 0.4 mg/dL (ref 0.0–1.2)
CO2: 25 mmol/L (ref 20–29)
Calcium: 8.8 mg/dL (ref 8.7–10.3)
Chloride: 101 mmol/L (ref 96–106)
Creatinine, Ser: 0.61 mg/dL (ref 0.57–1.00)
Globulin, Total: 2.8 g/dL (ref 1.5–4.5)
Glucose: 91 mg/dL (ref 70–99)
Potassium: 4.3 mmol/L (ref 3.5–5.2)
Sodium: 142 mmol/L (ref 134–144)
Total Protein: 7.1 g/dL (ref 6.0–8.5)
eGFR: 91 mL/min/{1.73_m2} (ref >59)

## 2023-11-21 LAB — TSH: TSH: 0.145 u[IU]/mL — ABNORMAL LOW (ref 0.450–4.500)

## 2023-11-21 LAB — MICROALBUMIN / CREATININE URINE RATIO
Creatinine, Urine: 40.1 mg/dL
Microalb/Creat Ratio: 12 mg/g{creat} (ref 0–29)
Microalbumin, Urine: 5 ug/mL

## 2023-11-21 LAB — LIPID PANEL
Chol/HDL Ratio: 2.4 {ratio} (ref 0.0–4.4)
Cholesterol, Total: 118 mg/dL (ref 100–199)
HDL: 50 mg/dL (ref >39)
LDL Chol Calc (NIH): 50 mg/dL (ref 0–99)
Triglycerides: 93 mg/dL (ref 0–149)
VLDL Cholesterol Cal: 18 mg/dL (ref 5–40)

## 2023-11-21 LAB — HEMOGLOBIN A1C
Est. average glucose Bld gHb Est-mCnc: 166 mg/dL
Hgb A1c MFr Bld: 7.4 % — ABNORMAL HIGH (ref 4.8–5.6)

## 2023-11-21 MED ORDER — LEVOTHYROXINE SODIUM 25 MCG PO TABS: 25.0000 ug | ORAL_TABLET | Freq: Every day | ORAL | 0 refills | Status: DC

## 2023-11-24 ENCOUNTER — Telehealth: Payer: Self-pay

## 2023-11-24 ENCOUNTER — Other Ambulatory Visit: Payer: Self-pay

## 2023-11-24 DIAGNOSIS — D696 Thrombocytopenia, unspecified: Secondary | ICD-10-CM

## 2023-11-24 NOTE — Telephone Encounter (Signed)
Reason for CRM: Patients levothyroxine dosage has changed and she does not recall her PCP changing her dosage so she just wanted to make sure she is taking the correct amount. Please call back on primary phone number to help clarify.   Called pt and went over Result notes with her to clarify medication dosage change

## 2023-11-28 ENCOUNTER — Ambulatory Visit (INDEPENDENT_AMBULATORY_CARE_PROVIDER_SITE_OTHER): Payer: Medicare Other | Admitting: Family Medicine

## 2023-11-28 VITALS — BP 114/70 | HR 83 | Temp 97.7°F | Ht 62.0 in | Wt 161.6 lb

## 2023-11-28 DIAGNOSIS — L602 Onychogryphosis: Secondary | ICD-10-CM

## 2023-11-28 DIAGNOSIS — S90932A Unspecified superficial injury of left great toe, initial encounter: Secondary | ICD-10-CM

## 2023-11-28 DIAGNOSIS — S99929A Unspecified injury of unspecified foot, initial encounter: Secondary | ICD-10-CM

## 2023-11-28 DIAGNOSIS — E119 Type 2 diabetes mellitus without complications: Secondary | ICD-10-CM | POA: Diagnosis not present

## 2023-11-28 NOTE — Patient Instructions (Signed)
No evidence of infection.  Referral placed to podiatry.

## 2023-11-30 DIAGNOSIS — S99929A Unspecified injury of unspecified foot, initial encounter: Secondary | ICD-10-CM | POA: Insufficient documentation

## 2023-11-30 NOTE — Progress Notes (Signed)
Subjective:  Patient ID: Valerie Thomas, female    DOB: 1946/10/15  Age: 77 y.o. MRN: [id number]  CC: Injury to the left great toenail   HPI:  77 year old female presents for evaluation the above.  Patient recently injured her left great toenail after hitting it on an object.  She states that distal aspect of the nail split.  She is concerned about infection given the fact that she is a diabetic.  No significant pain.  No other complaints or concerns at this time.  Patient Active Problem List   Diagnosis Date Noted   Injury of great toenail 11/30/2023   Mild intermittent asthma without complication 09/25/2023   Anaphylactic shock due to adverse food reaction 09/25/2023   Allergy to alpha-gal 09/25/2023   Seasonal allergic rhinitis due to pollen 09/25/2023   Mass of right thigh 07/15/2023   Knee pain 01/23/2023   Hyperlipidemia associated with type 2 diabetes mellitus (HCC) 10/22/2022   Hypothyroidism 10/22/2022   Chronic urticaria 04/30/2022   Obesity 02/13/2022   Gastroesophageal reflux disease 02/13/2022   Diabetes mellitus without complication (HCC) 02/13/2022   Hypertension 02/13/2022   Vertigo 02/06/2022    Social Hx   Social History   Socioeconomic History   Marital status: Widowed    Spouse name: Not on file   Number of children: Not on file   Years of education: Not on file   Highest education level: Not on file  Occupational History   Not on file  Tobacco Use   Smoking status: Never    Passive exposure: Past   Smokeless tobacco: Never  Substance and Sexual Activity   Alcohol use: No   Drug use: No   Sexual activity: Never  Other Topics Concern   Not on file  Social History Narrative   Not on file   Social Drivers of Health   Financial Resource Strain: High Risk (11/14/2023)   Overall Financial Resource Strain (CARDIA)    Difficulty of Paying Living Expenses: Hard  Food Insecurity: No Food Insecurity (11/14/2023)   Hunger Vital Sign    Worried  About Running Out of Food in the Last Year: Never true    Ran Out of Food in the Last Year: Never true  Transportation Needs: No Transportation Needs (11/14/2023)   PRAPARE - Administrator, Civil Service (Medical): No    Lack of Transportation (Non-Medical): No  Physical Activity: Insufficiently Active (11/14/2023)   Exercise Vital Sign    Days of Exercise per Week: 3 days    Minutes of Exercise per Session: 30 min  Stress: No Stress Concern Present (11/14/2023)   [employer] of Occupational Health - Occupational Stress Questionnaire    Feeling of Stress : Not at all  Social Connections: Socially Integrated (11/14/2023)   Social Connection and Isolation Panel [NHANES]    Frequency of Communication with Friends and Family: More than three times a week    Frequency of Social Gatherings with Friends and Family: More than three times a week    Attends Religious Services: More than 4 times per year    Active Member of [employer] or Organizations: Yes    Attends Engineer, structural: More than 4 times per year    Marital Status: Married    Review of Systems Per HPI  Objective:  BP 114/70   Pulse 83   Temp 97.7 F (36.5 C)   Ht 5\' 2"  (1.575 m)   Wt 161 lb 9.6 oz (73.3 kg)  SpO2 99%   BMI 29.56 kg/m      11/28/2023    2:30 PM 11/19/2023   11:21 AM 11/14/2023    3:03 PM  BP/Weight  Systolic BP 114 120 --  Diastolic BP 70 58 --  Wt. (Lbs) 161.6 165 160  BMI 29.56 kg/m2 30.18 kg/m2 29.26 kg/m2    Physical Exam Vitals and nursing note reviewed.  Constitutional:      General: She is not in acute distress. HENT:     Head: Normocephalic and atraumatic.  Cardiovascular:     Rate and Rhythm: Normal rate and regular rhythm.  Pulmonary:     Effort: Pulmonary effort is normal. No respiratory distress.  Feet:     Comments: Left great toenail -small split noted in the midline distally.  Nailbed intact. Neurological:     Mental Status: She is alert.     Lab Results  Component Value Date   WBC 7.4 11/19/2023   HGB 12.3 11/19/2023   HCT 39.8 11/19/2023   PLT 98 (LL) 11/19/2023   GLUCOSE 91 11/19/2023   CHOL 118 11/19/2023   TRIG 93 11/19/2023   HDL 50 11/19/2023   LDLCALC 50 11/19/2023   ALT 6 11/19/2023   AST 18 11/19/2023   NA 142 11/19/2023   K 4.3 11/19/2023   CL 101 11/19/2023   CREATININE 0.61 11/19/2023   BUN 7 (L) 11/19/2023   CO2 25 11/19/2023   TSH 0.145 (L) 11/19/2023   HGBA1C 7.4 (H) 11/19/2023     Assessment & Plan:   Problem List Items Addressed This Visit       Endocrine   Diabetes mellitus without complication [facility])   Relevant Orders   Ambulatory referral to Podiatry     Musculoskeletal and Integument   Injury of great toenail - Primary   Patient requested referral to podiatry so that her nails can be trimmed.  There is no evidence of infection.  Supportive care.      Other Visit Diagnoses       Thickened nails       Relevant Orders   Ambulatory referral to Podiatry      Everlene Other DO [facility]

## 2023-11-30 NOTE — Assessment & Plan Note (Signed)
Patient requested referral to podiatry so that her nails can be trimmed.  There is no evidence of infection.  Supportive care.

## 2023-12-04 ENCOUNTER — Telehealth: Payer: Self-pay

## 2023-12-04 NOTE — Telephone Encounter (Signed)
 Patient dropped off document  Diabetes Age Well Services , to be filled out by provider. Patient requested to send it back via Fax within 5-days. Document is located in providers tray at front office.Please advise at Mobile There is no such number on file (mobile).

## 2023-12-05 ENCOUNTER — Telehealth: Payer: Self-pay | Admitting: Pharmacist

## 2023-12-05 DIAGNOSIS — E119 Type 2 diabetes mellitus without complications: Secondary | ICD-10-CM

## 2023-12-05 NOTE — Telephone Encounter (Signed)
 Patient needs re-enrollment in [employer] If able to now fax, please email me entire PAP and I will take care of Med: Januvia 100mg  daily

## 2023-12-08 ENCOUNTER — Other Ambulatory Visit: Payer: Self-pay | Admitting: Family Medicine

## 2023-12-08 DIAGNOSIS — E119 Type 2 diabetes mellitus without complications: Secondary | ICD-10-CM

## 2023-12-10 ENCOUNTER — Ambulatory Visit (HOSPITAL_COMMUNITY)
Admission: RE | Admit: 2023-12-10 | Discharge: 2023-12-10 | Disposition: A | Discharge status: Home / Self Care | Payer: Medicare Other | Source: Ambulatory Visit | Attending: Family Medicine | Admitting: Family Medicine

## 2023-12-10 DIAGNOSIS — Z780 Asymptomatic menopausal state: Secondary | ICD-10-CM | POA: Diagnosis not present

## 2023-12-17 ENCOUNTER — Other Ambulatory Visit: Payer: Medicare Other | Admitting: Pharmacist

## 2023-12-17 ENCOUNTER — Other Ambulatory Visit: Payer: Self-pay | Admitting: Pharmacist

## 2023-12-17 NOTE — Progress Notes (Signed)
 12/17/2023 Name: Valerie Thomas MRN: [id number] DOB: 09-May-1946  Chief Complaint  Patient presents with   Diabetes    Valerie Thomas is a 78 y.o. year old female who presented for a telephone visit.   They were referred to the pharmacist by their PCP for assistance in managing diabetes and medication access.    Subjective:  Care Team: Primary Care Provider: Cook, Jayce G, DO ; Next Scheduled Visit: 01/2024   Medication Access/Adherence  Current Pharmacy:  [facility] [address] [place] - [address] [place] [address] [place] [address] Phone: [phone] Fax: [phone]  [facility] - [place], [place] - [address] [place] [address] Phone: [phone] Fax: [phone]   Patient reports affordability concerns with their medications: Yes with januvia  Patient reports access/transportation concerns to their pharmacy: No  Patient reports adherence concerns with their medications:  No     Diabetes:  Current medications: metformin  Medications tried in the past: januvia  (program ran out; hasn't had in months; BG seems to be okay, but checking A1c 12/22/23)  Current glucose readings: FBG<150 Using accuchek aviva meter  Patient denies hypoglycemic s/sx including dizziness, shakiness, sweating. Patient denies hyperglycemic symptoms including polyuria, polydipsia, polyphagia, nocturia, neuropathy, blurred vision.  Current meal patterns:  Discussed meal planning options and Plate method for healthy eating Avoid sugary drinks and desserts Incorporate balanced protein, non starchy veggies, 1 serving of carbohydrate with each meal Increase water intake Increase physical activity as able  Current physical activity: as able  Current medication access support: former meck januvia  patient (not current--will need to re-enroll if desired)    Objective:  Lab Results  Component Value Date   HGBA1C 7.4  (H) 11/19/2023    Lab Results  Component Value Date   CREATININE 0.61 11/19/2023   BUN 7 (L) 11/19/2023   NA 142 11/19/2023   K 4.3 11/19/2023   CL 101 11/19/2023   CO2 25 11/19/2023    Lab Results  Component Value Date   CHOL 118 11/19/2023   HDL 50 11/19/2023   LDLCALC 50 11/19/2023   TRIG 93 11/19/2023   CHOLHDL 2.4 11/19/2023    Medications Reviewed Today     Reviewed by Delilah Fend, [facility] (Pharmacist) on 12/17/23 at 1404  Med List Status: <None>   Medication Order Taking? Sig Documenting Provider Last Dose Status Informant  ACCU-CHEK AVIVA PLUS test strip [id number] No TESTS ONCE DAILY Cook, Jayce G, DO Taking Active   Accu-Chek Softclix Lancets lancets [id number] No TESTS ONCE DAILY Cook, Jayce G, DO Taking Active   acetaminophen  (TYLENOL ) 500 MG tablet [id number] No Take 1-2 tablets (500-1,000 mg total) by mouth every 6 (six) hours as needed. Coretta Dexter, [place] Taking Active   albuterol  (VENTOLIN  HFA) 108 (90 Base) MCG/ACT inhaler [id number]  USE 2 INHALATIONS BY MOUTH EVERY 6 HOURS AS NEEDED FOR WHEEZING  OR SHORTNESS OF BREATH Cook, Jayce G, DO  Active   amLODipine  (NORVASC ) 5 MG tablet [id number] No TAKE 1 TABLET BY MOUTH DAILY Cook, Jayce G, DO Taking Active   amoxicillin -clavulanate (AUGMENTIN ) 875-125 MG tablet [id number]  Take 1 tablet by mouth 2 (two) times daily. Cook, Jayce G, DO  Active   azelastine  (ASTELIN ) 0.1 % nasal spray [id number] No Place 2 sprays in each nostril once or twice a day as needed for a runny nose Ambs, Jeanmarie Millet, FNP Taking Active   Cod Liver Oil CAPS  [id number] No Take 1 capsule by mouth daily. [provider] Taking Active Self  EPINEPHrine  0.3 mg/0.3 mL IJ SOAJ injection [id number] No Inject 0.3 mg into the muscle as needed for anaphylaxis. Ardie Kras, FNP Taking Active   fluticasone  (FLONASE ) 50 MCG/ACT nasal spray [id number] No Place 2 sprays into both nostrils daily as needed (stuffy nose). Ardie Kras, FNP Taking Active   gabapentin   (NEURONTIN ) 300 MG capsule [id number] No Take 1 capsule by mouth twice daily Cook, Jayce G, DO Taking Active   JANUVIA  100 MG tablet [id number] No Take 1 tablet (100 mg total) by mouth daily.  Patient not taking: Reported on 11/28/2023   Cook, Jayce G, DO Not Taking Active   levothyroxine  (SYNTHROID ) 25 MCG tablet [id number]  Take 1 tablet (25 mcg total) by mouth daily. Cook, Jayce G, DO  Active   loratadine  (CLARITIN ) 10 MG tablet [id number] No Take 1 tablet (10 mg total) by mouth every morning. Rochester Chuck, MD Taking Active   metFORMIN  (GLUCOPHAGE ) 500 MG tablet [id number] No TAKE 1 TABLET BY MOUTH TWICE  DAILY WITH MEALS Cook, Jayce G, DO Taking Active   montelukast  (SINGULAIR ) 10 MG tablet [id number] No TAKE 1 TABLET BY MOUTH DAILY Rochester Chuck, MD Taking Active   potassium chloride  SA (KLOR-CON  M) 20 MEQ tablet [id number] No Take 1 tablet (20 mEq total) by mouth 2 (two) times daily. Cook, Jayce G, DO Taking Active   promethazine -dextromethorphan (PROMETHAZINE -DM) 6.25-15 MG/5ML syrup [id number]  Take 5 mLs by mouth 4 (four) times daily as needed for cough. Cook, Jayce G, DO  Active   simvastatin  (ZOCOR ) 40 MG tablet [id number] No TAKE 1 TABLET BY MOUTH DAILY Cook, Jayce G, DO Taking Active   traMADol  (ULTRAM ) 50 MG tablet [id number] No Take 1 tablet (50 mg total) by mouth every 12 (twelve) hours as needed for severe pain or moderate pain. Cook, Jayce G, DO Taking Active   valsartan -hydrochlorothiazide  (DIOVAN -HCT) 160-25 MG tablet [id number] No TAKE 1 TABLET BY MOUTH DAILY Cook, Jayce G, DO Taking Active              Assessment/Plan:   Diabetes: - Currently controlled--A1c slightly above goal at 7.4%--patient reports BG is back to normal - Reviewed long term cardiovascular and renal outcomes of uncontrolled blood sugar - Reviewed goal A1c, goal fasting, and goal 2 hour post prandial glucose - Reviewed dietary modifications including FOLLOWING A HEART HEALTHY DIET/HEALTHY PLATE  METHOD - Recommend to continue metformin  Was on Januvia  (program ran out; hasn't had in months; BG seems to be okay, but checking A1c 12/22/23)  Will determine next steps post blood work next week - Patient denies personal or family history of multiple endocrine neoplasia type 2, medullary thyroid  cancer; personal history of pancreatitis or gallbladder disease. - Recommend to check glucose daily (fasting) or if symptomatic    Follow Up Plan: 2 weeks  Marvell Slider, PharmD, BCACP, CPP Clinical Pharmacist, [facility]

## 2023-12-23 ENCOUNTER — Other Ambulatory Visit: Payer: Self-pay | Admitting: Family Medicine

## 2023-12-24 DIAGNOSIS — E119 Type 2 diabetes mellitus without complications: Secondary | ICD-10-CM | POA: Diagnosis not present

## 2023-12-24 LAB — HM DIABETES EYE EXAM

## 2023-12-26 DIAGNOSIS — D696 Thrombocytopenia, unspecified: Secondary | ICD-10-CM | POA: Diagnosis not present

## 2023-12-27 LAB — CBC WITH DIFFERENTIAL/PLATELET
Basophils Absolute: 0.1 10*3/uL (ref 0.0–0.2)
Basos: 1 %
EOS (ABSOLUTE): 0.1 10*3/uL (ref 0.0–0.4)
Eos: 1 %
Hematocrit: 39.2 % (ref 34.0–46.6)
Hemoglobin: 12.4 g/dL (ref 11.1–15.9)
Immature Grans (Abs): 0 10*3/uL (ref 0.0–0.1)
Immature Granulocytes: 0 %
Lymphocytes Absolute: 1.6 10*3/uL (ref 0.7–3.1)
Lymphs: 18 %
MCH: 27.4 pg (ref 26.6–33.0)
MCHC: 31.6 g/dL (ref 31.5–35.7)
MCV: 87 fL (ref 79–97)
Monocytes Absolute: 0.7 10*3/uL (ref 0.1–0.9)
Monocytes: 8 %
Neutrophils Absolute: 6.4 10*3/uL (ref 1.4–7.0)
Neutrophils: 72 %
Platelets: 130 10*3/uL — ABNORMAL LOW (ref 150–450)
RBC: 4.52 x10E6/uL (ref 3.77–5.28)
RDW: 14.3 % (ref 11.7–15.4)
WBC: 8.9 10*3/uL (ref 3.4–10.8)

## 2024-01-06 ENCOUNTER — Other Ambulatory Visit: Payer: Self-pay | Admitting: Family Medicine

## 2024-01-06 ENCOUNTER — Other Ambulatory Visit: Payer: Self-pay | Admitting: Allergy & Immunology

## 2024-01-06 DIAGNOSIS — E119 Type 2 diabetes mellitus without complications: Secondary | ICD-10-CM

## 2024-01-06 DIAGNOSIS — I10 Essential (primary) hypertension: Secondary | ICD-10-CM

## 2024-01-06 DIAGNOSIS — E1169 Type 2 diabetes mellitus with other specified complication: Secondary | ICD-10-CM

## 2024-01-07 ENCOUNTER — Other Ambulatory Visit: Payer: Self-pay | Admitting: Pharmacist

## 2024-01-07 DIAGNOSIS — J4520 Mild intermittent asthma, uncomplicated: Secondary | ICD-10-CM

## 2024-01-07 MED ORDER — ALBUTEROL SULFATE HFA 108 (90 BASE) MCG/ACT IN AERS: INHALATION_SPRAY | RESPIRATORY_TRACT | 3 refills | Status: DC

## 2024-01-07 NOTE — Progress Notes (Signed)
 01/07/2024 Name: Valerie Thomas MRN: [id number] DOB: 1946/08/03  Chief Complaint  Patient presents with   Diabetes    Valerie Thomas is a 78 y.o. year old female who presented for a telephone visit.   They were referred to the pharmacist by their PCP for assistance in managing diabetes and medication access.    Subjective:  Care Team: Primary Care Provider: Cook, Jayce G, DO ; Next Scheduled Visit: 01/2024   Medication Access/Adherence  Current Pharmacy:  [facility] [address], [place] - [address] [place] [address] [place] [address] Phone: [phone] Fax: [phone]  [facility] - [place], [place] - [address] [place] [address] Phone: [phone] Fax: [phone]   Patient reports affordability concerns with their medications: Yes  Patient reports access/transportation concerns to their pharmacy: No  Patient reports adherence concerns with their medications:  No    Diabetes:  Current medications: metformin  Medications tried in the past: januvia  (program ran out; hasn't had in months; BG seems to be okay)  Current glucose readings: FBG<130, PPBG<185 Using accuchek aviva meter; testing daily [facility] We discussed when to check FBG vs PPBG  Patient denies hypoglycemic s/sx including dizziness, shakiness, sweating. Patient denies hyperglycemic symptoms including polyuria, polydipsia, polyphagia, nocturia, neuropathy, blurred vision.  Current meal patterns:  Discussed meal planning options and Plate method for healthy eating Avoid sugary drinks and desserts Incorporate balanced protein, non starchy veggies, 1 serving of carbohydrate with each meal Increase water intake Increase physical activity as able  Current physical activity: as able  Current medication access support: former meck januvia  patient (not current--will need to  re-enroll if desired)    Objective:  Lab Results  Component Value Date   HGBA1C 7.4 (H) 11/19/2023    Lab Results  Component Value Date   CREATININE 0.61 11/19/2023   BUN 7 (L) 11/19/2023   NA 142 11/19/2023   K 4.3 11/19/2023   CL 101 11/19/2023   CO2 25 11/19/2023    Lab Results  Component Value Date   CHOL 118 11/19/2023   HDL 50 11/19/2023   LDLCALC 50 11/19/2023   TRIG 93 11/19/2023   CHOLHDL 2.4 11/19/2023    Medications Reviewed Today     Reviewed by Billee Mliss BIRCH, [facility] (Pharmacist) on 01/07/24 at 1019  Med List Status: <None>   Medication Order Taking? Sig Documenting Provider Last Dose Status Informant  ACCU-CHEK AVIVA PLUS test strip [id number] No TESTS ONCE DAILY Cook, Jayce G, DO Taking Active   Accu-Chek Softclix Lancets lancets [id number] No TESTS ONCE DAILY Cook, Jayce G, DO Taking Active   acetaminophen  (TYLENOL ) 500 MG tablet [id number] No Take 1-2 tablets (500-1,000 mg total) by mouth every 6 (six) hours as needed. Neldon Hamp RAMAN, [place] Taking Active   albuterol  (VENTOLIN  HFA) 108 (90 Base) MCG/ACT inhaler [id number]  USE 2 INHALATIONS BY MOUTH EVERY 6 HOURS AS NEEDED FOR WHEEZING  OR SHORTNESS OF BREATH Cook, Jayce G, DO  Active   amLODipine  (NORVASC ) 5 MG tablet [id number] No TAKE 1 TABLET BY MOUTH DAILY Cook, Jayce G, DO Taking Active   amoxicillin -clavulanate (AUGMENTIN ) 875-125 MG tablet [id number]  Take 1 tablet by mouth 2 (two) times daily. Cook, Jayce G, DO  Active   azelastine  (ASTELIN ) 0.1 % nasal spray [id number] No Place 2 sprays in each nostril once or twice a day as needed for a  runny nose Ambs, Arlean HERO, FNP Taking Active   [facility] Liver Oil CAPS [phone] No Take 1 capsule by mouth daily. [provider] Taking Active Self  EPINEPHrine  0.3 mg/0.3 mL IJ SOAJ injection [id number] No Inject 0.3 mg into the muscle as needed for anaphylaxis. Cari Arlean HERO, FNP Taking Active   fluticasone  (FLONASE ) 50 MCG/ACT nasal spray [id number] No Place 2 sprays into  both nostrils daily as needed (stuffy nose). Cari Arlean HERO, FNP Taking Active   gabapentin  (NEURONTIN ) 300 MG capsule [id number] No Take 1 capsule by mouth twice daily Cook, Jayce G, DO Taking Active   JANUVIA  100 MG tablet [id number] No Take 1 tablet (100 mg total) by mouth daily.  Patient not taking: Reported on 11/28/2023   Cook, Jayce G, DO Not Taking Active   levothyroxine  (SYNTHROID ) 25 MCG tablet [id number]  Take 1 tablet (25 mcg total) by mouth daily. Cook, Jayce G, DO  Active   loratadine  (CLARITIN ) 10 MG tablet [id number] No Take 1 tablet (10 mg total) by mouth every morning. Iva Marty Saltness, MD Taking Active   metFORMIN  (GLUCOPHAGE ) 500 MG tablet [id number]  TAKE 1 TABLET BY MOUTH TWICE  DAILY WITH MEALS Cook, Jayce G, DO  Active   montelukast  (SINGULAIR ) 10 MG tablet [id number]  TAKE 1 TABLET BY MOUTH DAILY Ambs, Arlean HERO, FNP  Active   potassium chloride  SA (KLOR-CON  M) 20 MEQ tablet [id number]  TAKE 1 TABLET BY MOUTH TWICE  DAILY Cook, Jayce G, DO  Active   promethazine -dextromethorphan (PROMETHAZINE -DM) 6.25-15 MG/5ML syrup [id number]  Take 5 mLs by mouth 4 (four) times daily as needed for cough. Cook, Jayce G, DO  Active   simvastatin  (ZOCOR ) 40 MG tablet [id number]  TAKE 1 TABLET BY MOUTH DAILY Cook, Jayce G, DO  Active   traMADol  (ULTRAM ) 50 MG tablet [id number] No Take 1 tablet (50 mg total) by mouth every 12 (twelve) hours as needed for severe pain or moderate pain. Cook, Jayce G, DO Taking Active   valsartan -hydrochlorothiazide  (DIOVAN -HCT) 160-25 MG tablet [id number]  TAKE 1 TABLET BY MOUTH DAILY Cook, Jayce G, DO  Active             Assessment/Plan:   Diabetes: - Currently controlled--A1c slightly above goal at 7.4%--patient reports BG is back to normal - Reviewed long term cardiovascular and renal outcomes of uncontrolled blood sugar - Reviewed goal A1c, goal fasting, and goal 2 hour post prandial glucose - Reviewed dietary modifications including FOLLOWING A HEART HEALTHY  DIET/HEALTHY PLATE METHOD - Recommend to continue metformin  Was on Januvia  (program ran out; hasn't had in months; BG seems to be okay, but checking A1c 12/22/23)  Will determine next steps post blood work next week - Patient denies personal or family history of multiple endocrine neoplasia type 2, medullary thyroid  cancer; personal history of pancreatitis or gallbladder disease. - Recommend to check glucose daily (fasting) or if symptomatic -breathing controlled--refills sent in for albuterol    Follow Up Plan: 3 months   Mliss Tarry Griffin, PharmD, BCACP, CPP Clinical Pharmacist, [facility]

## 2024-01-09 ENCOUNTER — Telehealth: Payer: Self-pay

## 2024-01-09 NOTE — Telephone Encounter (Signed)
 Patient dropped off document DMV, to be filled out by provider. Patient requested to send it back via Mail within 7-days. Document is located in providers tray at front office.Please advise at Mobile There is no such number on file (mobile).

## 2024-01-12 DIAGNOSIS — E1140 Type 2 diabetes mellitus with diabetic neuropathy, unspecified: Secondary | ICD-10-CM | POA: Diagnosis not present

## 2024-01-12 DIAGNOSIS — M79671 Pain in right foot: Secondary | ICD-10-CM | POA: Diagnosis not present

## 2024-01-12 DIAGNOSIS — M79674 Pain in right toe(s): Secondary | ICD-10-CM | POA: Diagnosis not present

## 2024-01-12 DIAGNOSIS — E1142 Type 2 diabetes mellitus with diabetic polyneuropathy: Secondary | ICD-10-CM | POA: Diagnosis not present

## 2024-01-12 DIAGNOSIS — M79672 Pain in left foot: Secondary | ICD-10-CM | POA: Diagnosis not present

## 2024-01-12 DIAGNOSIS — M1990 Unspecified osteoarthritis, unspecified site: Secondary | ICD-10-CM | POA: Diagnosis not present

## 2024-01-12 DIAGNOSIS — M79675 Pain in left toe(s): Secondary | ICD-10-CM | POA: Diagnosis not present

## 2024-01-12 DIAGNOSIS — L565 Disseminated superficial actinic porokeratosis (DSAP): Secondary | ICD-10-CM | POA: Diagnosis not present

## 2024-01-15 NOTE — Telephone Encounter (Signed)
Tommie Sams, DO     Done. Given to [place].

## 2024-01-23 ENCOUNTER — Other Ambulatory Visit: Payer: Self-pay | Admitting: Family Medicine

## 2024-02-18 ENCOUNTER — Ambulatory Visit: Payer: Medicare Other | Admitting: Family Medicine

## 2024-02-18 VITALS — BP 122/72 | HR 93 | Temp 98.1°F | Ht 62.0 in | Wt 164.0 lb

## 2024-02-18 DIAGNOSIS — E1169 Type 2 diabetes mellitus with other specified complication: Secondary | ICD-10-CM | POA: Diagnosis not present

## 2024-02-18 DIAGNOSIS — M25562 Pain in left knee: Secondary | ICD-10-CM | POA: Diagnosis not present

## 2024-02-18 DIAGNOSIS — G8929 Other chronic pain: Secondary | ICD-10-CM | POA: Diagnosis not present

## 2024-02-18 DIAGNOSIS — E039 Hypothyroidism, unspecified: Secondary | ICD-10-CM

## 2024-02-18 DIAGNOSIS — J988 Other specified respiratory disorders: Secondary | ICD-10-CM

## 2024-02-18 DIAGNOSIS — B9789 Other viral agents as the cause of diseases classified elsewhere: Secondary | ICD-10-CM | POA: Diagnosis not present

## 2024-02-18 DIAGNOSIS — I10 Essential (primary) hypertension: Secondary | ICD-10-CM

## 2024-02-18 DIAGNOSIS — M25561 Pain in right knee: Secondary | ICD-10-CM

## 2024-02-18 DIAGNOSIS — E119 Type 2 diabetes mellitus without complications: Secondary | ICD-10-CM

## 2024-02-18 DIAGNOSIS — Z7984 Long term (current) use of oral hypoglycemic drugs: Secondary | ICD-10-CM

## 2024-02-18 MED ORDER — PROMETHAZINE-DM 6.25-15 MG/5ML PO SYRP: 5.0000 mL | ORAL_SOLUTION | Freq: Four times a day (QID) | ORAL | 0 refills | Status: DC | PRN

## 2024-02-18 MED ORDER — TRAMADOL HCL 50 MG PO TABS: 50.0000 mg | ORAL_TABLET | Freq: Two times a day (BID) | ORAL | 3 refills | Status: AC | PRN

## 2024-02-18 NOTE — Assessment & Plan Note (Signed)
 Stable.  Tramadol refilled.

## 2024-02-18 NOTE — Assessment & Plan Note (Signed)
 Stable.  Continue current medications.

## 2024-02-18 NOTE — Patient Instructions (Signed)
 Medication sent in.  I'll see you back in 3 months.  We'll do blook work then.

## 2024-02-18 NOTE — Assessment & Plan Note (Signed)
 Will reassess at follow-up.  Continue current dosing levothyroxine.

## 2024-02-18 NOTE — Assessment & Plan Note (Signed)
 Fair control given age.  Continue metformin.  Will reassess A1c at follow-up.

## 2024-02-18 NOTE — Assessment & Plan Note (Signed)
 Promethazine DM as needed for cough.

## 2024-02-18 NOTE — Progress Notes (Signed)
 Subjective:  Patient ID: Valerie Thomas, female    DOB: [birth year]  Age: 78 y.o. MRN: [id number]  CC:  Follow up   HPI:  78 year old presents for follow-up.  Patient reports that she recently caught cold.  She states that she is having some cough and sneezing.  She would like cough medication.  No fever.  Hypertension well-controlled on amlodipine and valsartan/HCTZ.  Patient also reports that she needs a refill on her tramadol regarding knee pain.  Last A1c was elevated at 7.4.  This is fair control given her advanced age.  She is not taking Januvia.  She is taking metformin only.  Additionally, last TSH was suppressed.  Levothyroxine was decreased.  Patient Active Problem List   Diagnosis Date Noted   Viral respiratory infection 02/18/2024   Mild intermittent asthma without complication 09/25/2023   Anaphylactic shock due to adverse food reaction 09/25/2023   Allergy to alpha-gal 09/25/2023   Seasonal allergic rhinitis due to pollen 09/25/2023   Mass of right thigh 07/15/2023   Knee pain 01/23/2023   Hyperlipidemia associated with type 2 diabetes mellitus (HCC) 10/22/2022   Hypothyroidism 10/22/2022   Chronic urticaria 04/30/2022   Obesity 02/13/2022   Gastroesophageal reflux disease 02/13/2022   Diabetes mellitus without complication (HCC) 02/13/2022   Hypertension 02/13/2022    Social Hx   Social History   Socioeconomic History   Marital status: Widowed    Spouse name: Not on file   Number of children: Not on file   Years of education: Not on file   Highest education level: Not on file  Occupational History   Not on file  Tobacco Use   Smoking status: Never    Passive exposure: Past   Smokeless tobacco: Never  Substance and Sexual Activity   Alcohol use: No   Drug use: No   Sexual activity: Never  Other Topics Concern   Not on file  Social History Narrative   Not on file   Social Drivers of Health   Financial Resource Strain: High Risk (11/14/2023)    Overall Financial Resource Strain (CARDIA)    Difficulty of Paying Living Expenses: Hard  Food Insecurity: No Food Insecurity (11/14/2023)   Hunger Vital Sign    Worried About Running Out of Food in the Last Year: Never true    Ran Out of Food in the Last Year: Never true  Transportation Needs: No Transportation Needs (11/14/2023)   PRAPARE - Administrator, Civil Service (Medical): No    Lack of Transportation (Non-Medical): No  Physical Activity: Insufficiently Active (11/14/2023)   Exercise Vital Sign    Days of Exercise per Week: 3 days    Minutes of Exercise per Session: 30 min  Stress: No Stress Concern Present (11/14/2023)   [employer] of Occupational Health - Occupational Stress Questionnaire    Feeling of Stress : Not at all  Social Connections: Socially Integrated (11/14/2023)   Social Connection and Isolation Panel [NHANES]    Frequency of Communication with Friends and Family: More than three times a week    Frequency of Social Gatherings with Friends and Family: More than three times a week    Attends Religious Services: More than 4 times per year    Active Member of [employer] or Organizations: Yes    Attends Engineer, structural: More than 4 times per year    Marital Status: Married    Review of Systems Per HPI  Objective:  BP 122/72  Pulse 93   Temp 98.1 F (36.7 C)   Ht 5\' 2"  (1.575 m)   Wt 164 lb (74.4 kg)   SpO2 97%   BMI 30.00 kg/m      02/18/2024   10:56 AM 11/28/2023    2:30 PM 11/19/2023   11:21 AM  BP/Weight  Systolic BP 122 114 120  Diastolic BP 72 70 58  Wt. (Lbs) 164 161.6 165  BMI 30 kg/m2 29.56 kg/m2 30.18 kg/m2    Physical Exam Vitals and nursing note reviewed.  Constitutional:      General: She is not in acute distress.    Appearance: Normal appearance.  HENT:     Head: Normocephalic and atraumatic.  Eyes:     General:        Right eye: No discharge.        Left eye: No discharge.      Conjunctiva/sclera: Conjunctivae normal.  Cardiovascular:     Rate and Rhythm: Normal rate and regular rhythm.  Pulmonary:     Effort: Pulmonary effort is normal.     Breath sounds: Normal breath sounds. No wheezing, rhonchi or rales.  Neurological:     Mental Status: She is alert.     Lab Results  Component Value Date   WBC 8.9 12/26/2023   HGB 12.4 12/26/2023   HCT 39.2 12/26/2023   PLT 130 (L) 12/26/2023   GLUCOSE 91 11/19/2023   CHOL 118 11/19/2023   TRIG 93 11/19/2023   HDL 50 11/19/2023   LDLCALC 50 11/19/2023   ALT 6 11/19/2023   AST 18 11/19/2023   NA 142 11/19/2023   K 4.3 11/19/2023   CL 101 11/19/2023   CREATININE 0.61 11/19/2023   BUN 7 (L) 11/19/2023   CO2 25 11/19/2023   TSH 0.145 (L) 11/19/2023   HGBA1C 7.4 (H) 11/19/2023     Assessment & Plan:  Primary hypertension Assessment & Plan: Stable.  Continue current medications.   Diabetes mellitus without complication [facility]) Assessment & Plan: Fair control given age.  Continue metformin.  Will reassess A1c at follow-up.   Hypothyroidism, unspecified type Assessment & Plan: Will reassess at follow-up.  Continue current dosing levothyroxine.   Chronic pain of both knees Assessment & Plan: Stable.  Tramadol refilled.  Orders: -     traMADol HCl; Take 1 tablet (50 mg total) by mouth every 12 (twelve) hours as needed for severe pain (pain score 7-10) or moderate pain (pain score 4-6).  Dispense: 30 tablet; Refill: 3  Viral respiratory infection Assessment & Plan: Promethazine DM as needed for cough.  Orders: -     Promethazine-DM; Take 5 mLs by mouth 4 (four) times daily as needed for cough.  Dispense: 118 mL; Refill: 0    Follow-up:  3 months  Kacy Hegna Adriana Simas DO [facility]

## 2024-03-01 ENCOUNTER — Other Ambulatory Visit (HOSPITAL_COMMUNITY): Payer: Self-pay | Admitting: Family Medicine

## 2024-03-01 DIAGNOSIS — Z1231 Encounter for screening mammogram for malignant neoplasm of breast: Secondary | ICD-10-CM

## 2024-03-09 ENCOUNTER — Other Ambulatory Visit: Payer: Self-pay | Admitting: Family Medicine

## 2024-03-12 ENCOUNTER — Other Ambulatory Visit: Payer: Self-pay | Admitting: Family Medicine

## 2024-03-12 DIAGNOSIS — I10 Essential (primary) hypertension: Secondary | ICD-10-CM

## 2024-03-16 ENCOUNTER — Other Ambulatory Visit: Payer: Self-pay | Admitting: Family Medicine

## 2024-03-18 IMAGING — MG MM DIGITAL SCREENING BILAT W/ TOMO AND CAD
6 of 12 series · 6 of 36 positions shown · non-contrast
Comparison: Previous exam(s).

ACR Breast Density Category a: The breast tissue is almost entirely
fatty.

CLINICAL DATA: Screening.

EXAM:
DIGITAL SCREENING BILATERAL MAMMOGRAM WITH TOMOSYNTHESIS AND CAD
TECHNIQUE: Bilateral screening digital craniocaudal and mediolateral oblique
mammograms were obtained. Bilateral screening digital breast
tomosynthesis was performed. The images were evaluated with
computer-aided detection.

[R MLO synth-2D (1 of 2)]
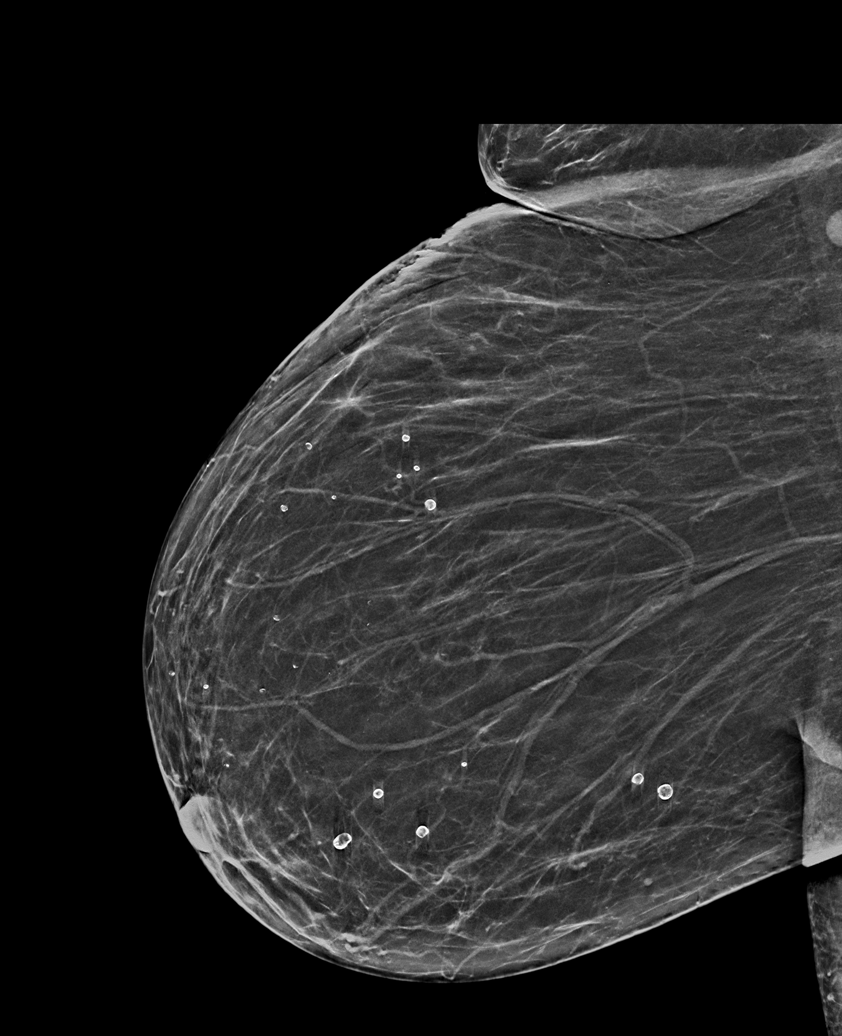

[R CC synth-2D]
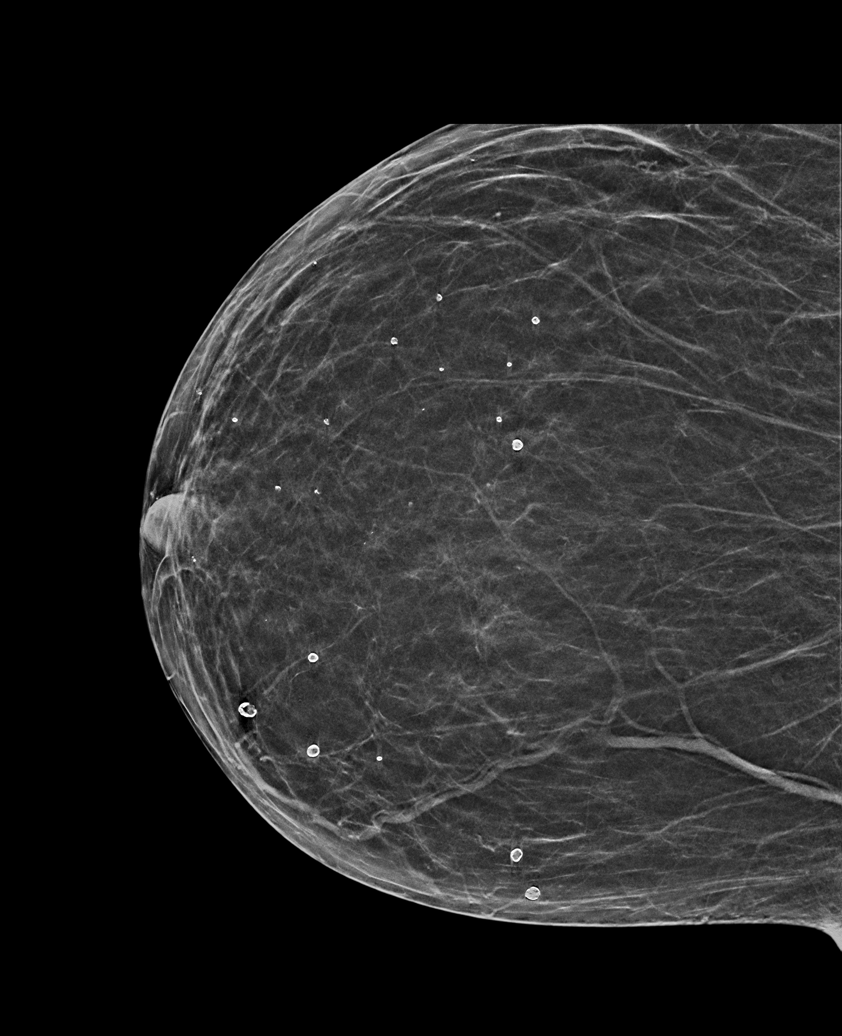

[R MLO synth-2D (2 of 2)]
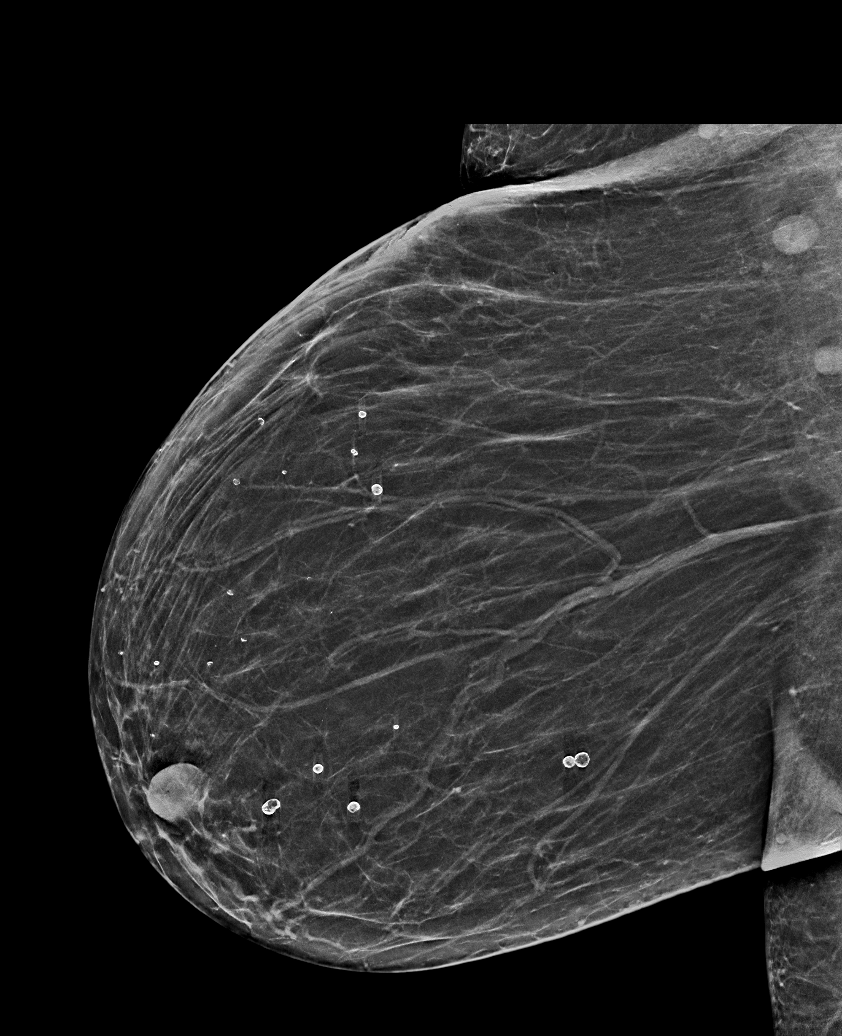

[L MLO synth-2D (1 of 2)]
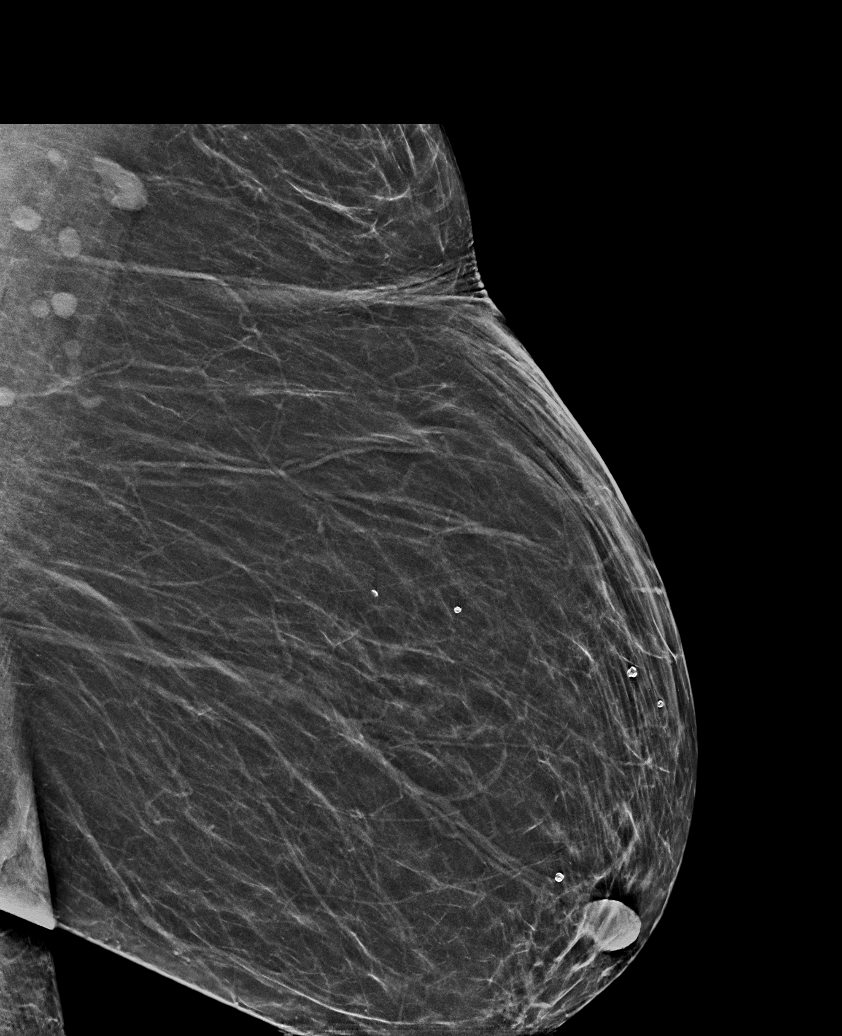

[L CC synth-2D]
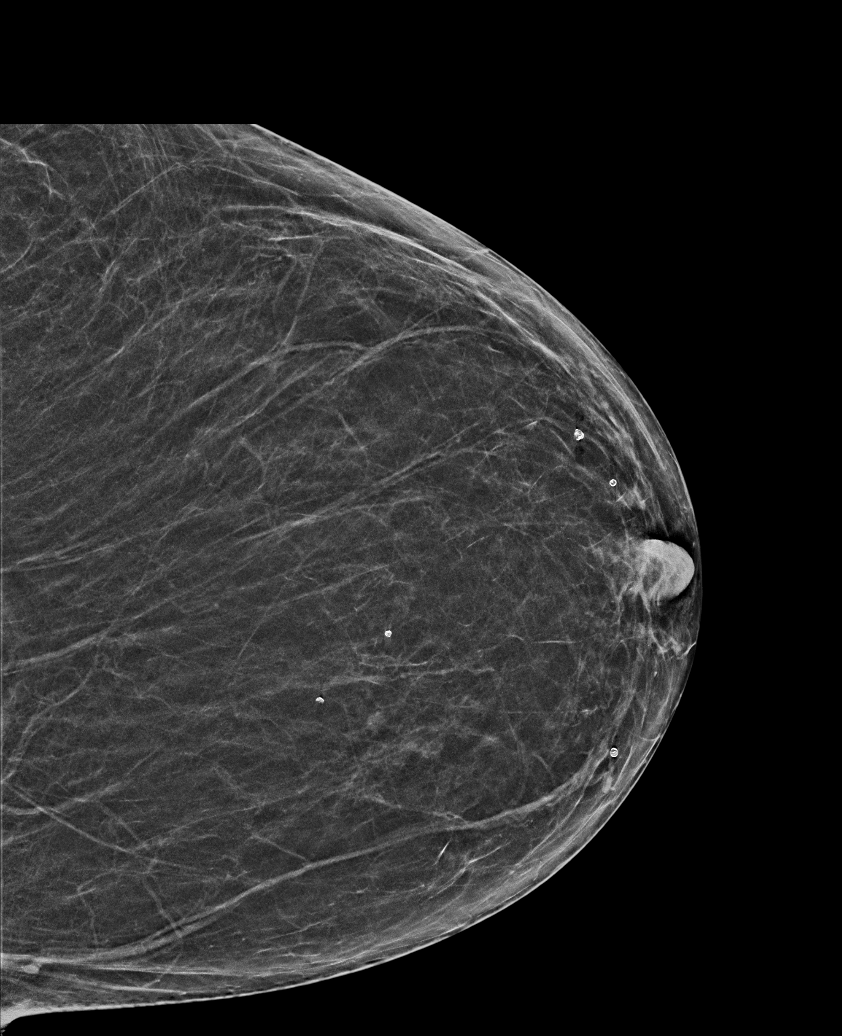

[L MLO synth-2D (2 of 2)]
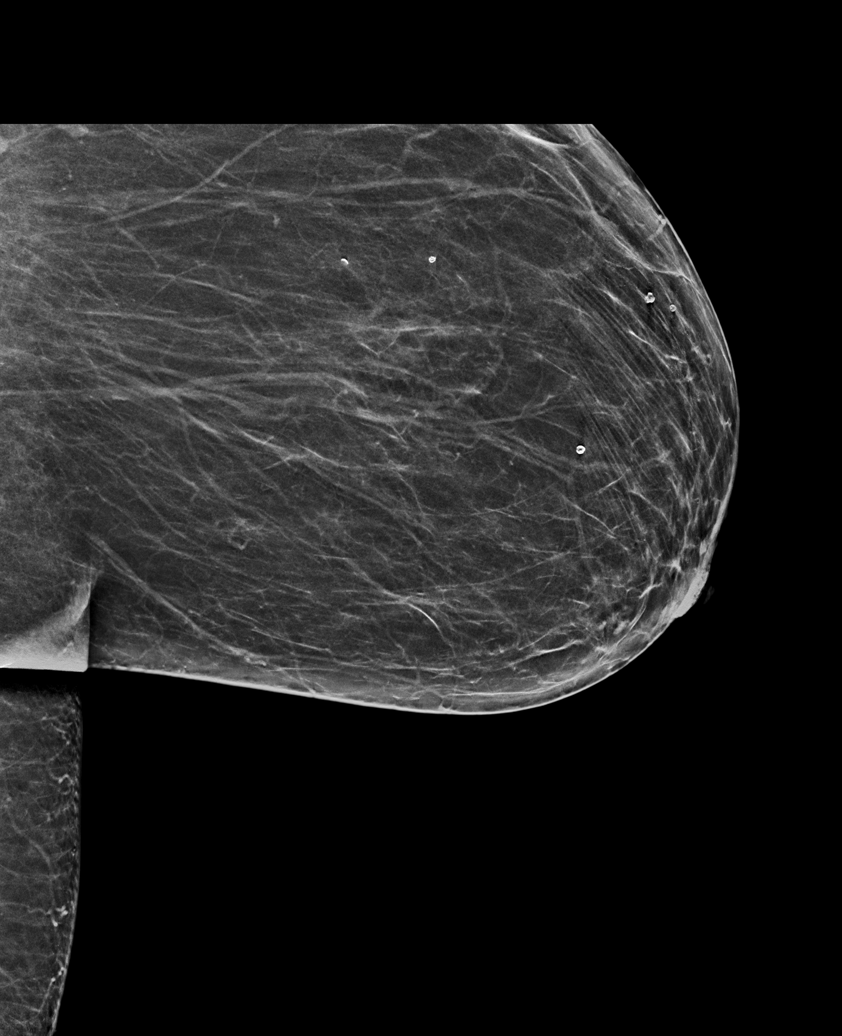

[6 of 36 positions shown; findings below may reference images not displayed]

FINDINGS: There are no findings suspicious for malignancy.
IMPRESSION: No mammographic evidence of malignancy. A result letter of this
screening mammogram will be mailed directly to the patient.

RECOMMENDATION:
Screening mammogram in one year. (Code:0E-3-N98)

BI-RADS CATEGORY  1: Negative.

## 2024-03-22 DIAGNOSIS — L565 Disseminated superficial actinic porokeratosis (DSAP): Secondary | ICD-10-CM | POA: Diagnosis not present

## 2024-03-22 DIAGNOSIS — E1140 Type 2 diabetes mellitus with diabetic neuropathy, unspecified: Secondary | ICD-10-CM | POA: Diagnosis not present

## 2024-03-22 DIAGNOSIS — M79674 Pain in right toe(s): Secondary | ICD-10-CM | POA: Diagnosis not present

## 2024-03-22 DIAGNOSIS — E1142 Type 2 diabetes mellitus with diabetic polyneuropathy: Secondary | ICD-10-CM | POA: Diagnosis not present

## 2024-03-22 DIAGNOSIS — M79671 Pain in right foot: Secondary | ICD-10-CM | POA: Diagnosis not present

## 2024-03-22 DIAGNOSIS — M79672 Pain in left foot: Secondary | ICD-10-CM | POA: Diagnosis not present

## 2024-03-22 DIAGNOSIS — M79675 Pain in left toe(s): Secondary | ICD-10-CM | POA: Diagnosis not present

## 2024-03-22 DIAGNOSIS — M1990 Unspecified osteoarthritis, unspecified site: Secondary | ICD-10-CM | POA: Diagnosis not present

## 2024-03-23 NOTE — Patient Instructions (Incomplete)
 Asthma Continue montelukast  10 mg once a day to prevent cough or wheeze Continue continue albuterol  2 puffs once every 4 hours as needed for cough or wheeze You may use albuterol  2 puffs 5 to 15 minutes before activity to decrease cough or wheeze For now and for asthma flare, begin Flovent  44-2 puffs twice a day with a spacer for 1-2 weeks or until cough and wheeze free, then stop  Allergic rhinitis Continue allergen avoidance measures directed toward tree pollen as listed below Continue Flonase  2 sprays in each nostril once a day as needed for stuffy nose Begin azelastine  2 sprays in each nostril once or twice a day as needed for runny nose. If needed for a runny nose or itch you may continue Claritin . Be aware that this medication may make you sleepy.  Consider saline nasal rinses as needed for nasal symptoms. Use this before any medicated nasal sprays for best result  Alpha gal/urticaria Continue to avoid mammalian meat.  In case of an allergic reaction, give Benadryl 50 mg every 4 hours, and if life-threatening symptoms occur, inject with EpiPen  0.3 mg.  Insect bite Begin triamcinolone  to red or itchy area up to twice a day if needed. Do not use this medication longer than 2 weeks in a row.  Call the clinic if the area on your leg gets bigger  Call the clinic if this treatment plan is not working well for you.  Follow up in 3 months or sooner if needed.  Reducing Pollen Exposure The American Academy of Allergy , Asthma and Immunology suggests the following steps to reduce your exposure to pollen during allergy  seasons. Do not hang sheets or clothing out to dry; pollen may collect on these items. Do not mow lawns or spend time around freshly cut grass; mowing stirs up pollen. Keep windows closed at night.  Keep car windows closed while driving. Minimize morning activities outdoors, a time when pollen counts are usually at their highest. Stay indoors as much as possible when pollen  counts or humidity is high and on windy days when pollen tends to remain in the air longer. Use air conditioning when possible.  Many air conditioners have filters that trap the pollen spores. Use a HEPA room air filter to remove pollen form the indoor air you breathe.

## 2024-03-23 NOTE — Progress Notes (Addendum)
 [address] [place] [address] Dept: [phone]  FOLLOW UP NOTE  Patient ID: Valerie Thomas, female    DOB: 09/04/1946  Age: 78 y.o. MRN: [id number] Date of Office Visit: 03/24/2024  Assessment  Chief Complaint: Follow-up (With the pollen she has had to use her albuterol  a bit more frequently- cough,SOB), Eczema (Around the neck area), and Pruritus (Legs have been itchy- had a lamb chop on easter and is unsure if that is contributing to the itching. )  HPI Valerie Thomas is a 78 year old female who presents to the clinic for follow-up visit.  She was last seen in this clinic on 09/24/2023 by Marinus Sic, FNP, for evaluation of asthma, allergic rhinitis, urticaria, and alpha-gal allergy .   At today's visit, she reports her asthma has been moderately well-controlled with occasional shortness of breath with rest and activity as well as dry cough over the last 2 weeks.  She denies wheeze with activity or rest.  She continues albuterol  daily over the last 2 weeks with short-term relief of symptoms.   Allergic rhinitis is reported as moderately well-controlled with symptoms including clear rhinorrhea, nasal congestion, and occasional sneezing.  She continues Claritin  10 mg once a day, and uses azelastine  and Flonase  as needed.  She is not currently using nasal saline rinses.  Her last environmental allergy  skin testing was on 06/21/2022 and was positive to tree pollen.    She reports that she has recently been pulling weeds in the garden area and noticed a raised red and itchy area on her right leg on Monday. She reports no that she has used alcohol to decrease the itch with moderate relief of itch. She does not know whick type of insect got her.   She reports that she ate a piece of lamb on Easter Sunday and experienced generalized itching afterward.  She denies hives, gastrointestinal symptoms, or cardiopulmonary symptoms after eating lamb.  Otherwise, she continues to avoid  mammalian meat.  Her last alpha gal panel on 09/24/2023 indicated alpha gal IgE 60.60 and components including lamb, beef, and pork were positive.  EpiPen  sent is up-to-date.  Her current medications are listed in the chart.  Drug Allergies:  Allergies  Allergen Reactions   Alpha-Gal Other (See Comments)    Itching, diarrhea    Physical Exam: BP 112/64   Pulse (!) 105   Temp 98.1 F (36.7 C)   Resp 12   Wt 162 lb (73.5 kg)   SpO2 95%   BMI 29.63 kg/m    Physical Exam Vitals reviewed.  Constitutional:      Appearance: Normal appearance.  HENT:     Head: Normocephalic and atraumatic.     Right Ear: Tympanic membrane normal.     Left Ear: Tympanic membrane normal.     Nose:     Comments: Bilateral nares slightly erythematous with thin clear nasal drainage noted.  Pharynx normal.  Ears normal.  Eyes normal.    Mouth/Throat:     Pharynx: Oropharynx is clear.  Eyes:     Conjunctiva/sclera: Conjunctivae normal.  Cardiovascular:     Rate and Rhythm: Normal rate and regular rhythm.     Heart sounds: Normal heart sounds. No murmur heard. Pulmonary:     Effort: Pulmonary effort is normal.     Breath sounds: Normal breath sounds.     Comments: Lungs clear to auscultation Musculoskeletal:        General: Normal range of motion.  Cervical back: Normal range of motion and neck supple.  Skin:    General: Skin is warm and dry.  Neurological:     Mental Status: She is alert and oriented to person, place, and time.  Psychiatric:        Mood and Affect: Mood normal.        Behavior: Behavior normal.        Thought Content: Thought content normal.        Judgment: Judgment normal.   Right lower extremity   Diagnostics: FVC 1.37 which is 74% of predicted value, FEV1 1.35 which is 95% of predicted value.  Spirometry indicates normal ventilatory function.  Assessment and Plan: 1. Not well controlled asthma without complication   2. Seasonal allergic rhinitis due to pollen    3. Chronic urticaria   4. Allergy  to alpha-gal   5. Insect bite of right lower leg, initial encounter     Meds ordered this encounter  Medications   azelastine  (ASTELIN ) 0.1 % nasal spray    Sig: Place 2 sprays in each nostril once or twice a day as needed for a runny nose    Dispense:  30 mL    Refill:  5   fluticasone  (FLONASE ) 50 MCG/ACT nasal spray    Sig: Place 2 sprays into both nostrils daily as needed (stuffy nose).    Dispense:  16 g    Refill:  5   triamcinolone  ointment (KENALOG ) 0.1 %    Sig: Apply 1 Application topically 2 (two) times daily.    Dispense:  30 g    Refill:  0   fluticasone -salmeterol (WIXELA INHUB) 100-50 MCG/ACT AEPB    Sig: Use 1 puff once a day for 2 weeks or until cough and wheeze free, then stop    Dispense:  1 each    Refill:  0    Patient Instructions  Asthma Continue montelukast  10 mg once a day to prevent cough or wheeze Continue continue albuterol  2 puffs once every 4 hours as needed for cough or wheeze You may use albuterol  2 puffs 5 to 15 minutes before activity to decrease cough or wheeze For now and for asthma flare, begin Flovent  44-2 puffs twice a day with a spacer for 1-2 weeks or until cough and wheeze free, then stop  Allergic rhinitis Continue allergen avoidance measures directed toward tree pollen as listed below Continue Flonase  2 sprays in each nostril once a day as needed for stuffy nose Begin azelastine  2 sprays in each nostril once or twice a day as needed for runny nose. If needed for a runny nose or itch you may continue Claritin . Be aware that this medication may make you sleepy.  Consider saline nasal rinses as needed for nasal symptoms. Use this before any medicated nasal sprays for best result  Alpha gal/urticaria Continue to avoid mammalian meat.  In case of an allergic reaction, give Benadryl 50 mg every 4 hours, and if life-threatening symptoms occur, inject with EpiPen  0.3 mg.  Insect bite Begin triamcinolone   to red or itchy area up to twice a day if needed. Do not use this medication longer than 2 weeks in a row.  Call the clinic if the area on your leg gets bigger  Call the clinic if this treatment plan is not working well for you.  Follow up in 3 months or sooner if needed.  Return in about 3 months (around 06/23/2024), or if symptoms worsen or fail to improve.  Thank you for the opportunity to care for this patient.  Please do not hesitate to contact me with questions.  Marinus Sic, FNP [facility] 

## 2024-03-24 ENCOUNTER — Ambulatory Visit: Payer: Medicare Other | Admitting: Family Medicine

## 2024-03-24 ENCOUNTER — Encounter: Payer: Self-pay | Admitting: Family Medicine

## 2024-03-24 VITALS — BP 112/64 | HR 105 | Temp 98.1°F | Resp 12 | Wt 162.0 lb

## 2024-03-24 DIAGNOSIS — J301 Allergic rhinitis due to pollen: Secondary | ICD-10-CM

## 2024-03-24 DIAGNOSIS — S80861D Insect bite (nonvenomous), right lower leg, subsequent encounter: Secondary | ICD-10-CM | POA: Diagnosis not present

## 2024-03-24 DIAGNOSIS — S80861A Insect bite (nonvenomous), right lower leg, initial encounter: Secondary | ICD-10-CM

## 2024-03-24 DIAGNOSIS — Z91018 Allergy to other foods: Secondary | ICD-10-CM | POA: Diagnosis not present

## 2024-03-24 DIAGNOSIS — L508 Other urticaria: Secondary | ICD-10-CM | POA: Diagnosis not present

## 2024-03-24 DIAGNOSIS — J45998 Other asthma: Secondary | ICD-10-CM

## 2024-03-24 DIAGNOSIS — J45909 Unspecified asthma, uncomplicated: Secondary | ICD-10-CM

## 2024-03-24 MED ORDER — AZELASTINE HCL 0.1 % NA SOLN: NASAL | 5 refills | Status: AC

## 2024-03-24 MED ORDER — FLUTICASONE-SALMETEROL 100-50 MCG/ACT IN AEPB: INHALATION_SPRAY | RESPIRATORY_TRACT | 0 refills | Status: AC

## 2024-03-24 MED ORDER — FLUTICASONE PROPIONATE 50 MCG/ACT NA SUSP: 2.0000 | Freq: Every day | NASAL | 5 refills | Status: AC | PRN

## 2024-03-24 MED ORDER — TRIAMCINOLONE ACETONIDE 0.1 % EX OINT: 1.0000 | TOPICAL_OINTMENT | Freq: Two times a day (BID) | CUTANEOUS | 0 refills | Status: DC

## 2024-04-02 ENCOUNTER — Ambulatory Visit (HOSPITAL_COMMUNITY)
Admission: RE | Admit: 2024-04-02 | Discharge: 2024-04-02 | Disposition: A | Discharge status: Home / Self Care | Source: Ambulatory Visit | Attending: Family Medicine | Admitting: Family Medicine

## 2024-04-02 ENCOUNTER — Other Ambulatory Visit: Payer: Self-pay | Admitting: Family Medicine

## 2024-04-02 DIAGNOSIS — Z1231 Encounter for screening mammogram for malignant neoplasm of breast: Secondary | ICD-10-CM | POA: Diagnosis not present

## 2024-05-03 ENCOUNTER — Other Ambulatory Visit: Payer: Self-pay | Admitting: Family Medicine

## 2024-05-17 ENCOUNTER — Ambulatory Visit (INDEPENDENT_AMBULATORY_CARE_PROVIDER_SITE_OTHER): Admitting: Family Medicine

## 2024-05-17 VITALS — BP 118/72 | HR 91 | Temp 98.2°F | Ht 62.0 in | Wt 163.4 lb

## 2024-05-17 DIAGNOSIS — I10 Essential (primary) hypertension: Secondary | ICD-10-CM

## 2024-05-17 DIAGNOSIS — E119 Type 2 diabetes mellitus without complications: Secondary | ICD-10-CM

## 2024-05-17 DIAGNOSIS — E785 Hyperlipidemia, unspecified: Secondary | ICD-10-CM

## 2024-05-17 DIAGNOSIS — E1169 Type 2 diabetes mellitus with other specified complication: Secondary | ICD-10-CM

## 2024-05-17 DIAGNOSIS — D696 Thrombocytopenia, unspecified: Secondary | ICD-10-CM | POA: Diagnosis not present

## 2024-05-17 DIAGNOSIS — E039 Hypothyroidism, unspecified: Secondary | ICD-10-CM

## 2024-05-17 NOTE — Patient Instructions (Signed)
Continue your medications.  Labs today.  Follow up in 6 months.

## 2024-05-18 LAB — CMP14+EGFR
ALT: 6 IU/L (ref 0–32)
AST: 15 IU/L (ref 0–40)
Albumin: 4.4 g/dL (ref 3.8–4.8)
Alkaline Phosphatase: 104 IU/L (ref 44–121)
BUN/Creatinine Ratio: 14 (ref 12–28)
BUN: 9 mg/dL (ref 8–27)
Bilirubin Total: 0.6 mg/dL (ref 0.0–1.2)
CO2: 23 mmol/L (ref 20–29)
Calcium: 9.2 mg/dL (ref 8.7–10.3)
Chloride: 101 mmol/L (ref 96–106)
Creatinine, Ser: 0.63 mg/dL (ref 0.57–1.00)
Globulin, Total: 3 g/dL (ref 1.5–4.5)
Glucose: 89 mg/dL (ref 70–99)
Potassium: 3.8 mmol/L (ref 3.5–5.2)
Sodium: 143 mmol/L (ref 134–144)
Total Protein: 7.4 g/dL (ref 6.0–8.5)
eGFR: 90 mL/min/{1.73_m2} (ref >59)

## 2024-05-18 LAB — CBC
Hematocrit: 40 % (ref 34.0–46.6)
Hemoglobin: 12.8 g/dL (ref 11.1–15.9)
MCH: 27.9 pg (ref 26.6–33.0)
MCHC: 32 g/dL (ref 31.5–35.7)
MCV: 87 fL (ref 79–97)
Platelets: 121 10*3/uL — ABNORMAL LOW (ref 150–450)
RBC: 4.59 x10E6/uL (ref 3.77–5.28)
RDW: 14 % (ref 11.7–15.4)
WBC: 11 10*3/uL — ABNORMAL HIGH (ref 3.4–10.8)

## 2024-05-18 LAB — MICROALBUMIN / CREATININE URINE RATIO
Creatinine, Urine: 12.7 mg/dL
Microalb/Creat Ratio: <24 mg/g{creat} (ref 0–29)
Microalbumin, Urine: <3 ug/mL

## 2024-05-18 LAB — LIPID PANEL
Chol/HDL Ratio: 2.1 ratio (ref 0.0–4.4)
Cholesterol, Total: 123 mg/dL (ref 100–199)
HDL: 58 mg/dL (ref >39)
LDL Chol Calc (NIH): 51 mg/dL (ref 0–99)
Triglycerides: 67 mg/dL (ref 0–149)
VLDL Cholesterol Cal: 14 mg/dL (ref 5–40)

## 2024-05-18 LAB — TSH: TSH: 0.95 u[IU]/mL (ref 0.450–4.500)

## 2024-05-18 LAB — HEMOGLOBIN A1C
Est. average glucose Bld gHb Est-mCnc: 157 mg/dL
Hgb A1c MFr Bld: 7.1 % — ABNORMAL HIGH (ref 4.8–5.6)

## 2024-05-18 NOTE — Assessment & Plan Note (Signed)
 Stable.  Continue current medication.  Labs today.

## 2024-05-18 NOTE — Progress Notes (Signed)
 Subjective:  Patient ID: Valerie Thomas, female    DOB: 04-Sep-1946  Age: 78 y.o. MRN: [id number]  CC: Follow-up  HPI:  78 year old female presents for follow up.  Hypertension stable.  Needs labs today.  Compliant with metformin  regarding type 2 diabetes.  Lipids have been stable on simvastatin .  Needs reassessment today.  Patient reports of resolving rash to her right lower extremity.  Using triamcinolone .  Patient Active Problem List   Diagnosis Date Noted   Mild intermittent asthma without complication 09/25/2023   Allergy  to alpha-gal 09/25/2023   Seasonal allergic rhinitis due to pollen 09/25/2023   Mass of right thigh 07/15/2023   Knee pain 01/23/2023   Hyperlipidemia associated with type 2 diabetes mellitus (HCC) 10/22/2022   Hypothyroidism 10/22/2022   Chronic urticaria 04/30/2022   Obesity 02/13/2022   Gastroesophageal reflux disease 02/13/2022   Diabetes mellitus without complication (HCC) 02/13/2022   Hypertension 02/13/2022    Social Hx   Social History   Socioeconomic History   Marital status: Widowed    Spouse name: Not on file   Number of children: Not on file   Years of education: Not on file   Highest education level: Not on file  Occupational History   Not on file  Tobacco Use   Smoking status: Never    Passive exposure: Past   Smokeless tobacco: Never  Substance and Sexual Activity   Alcohol use: No   Drug use: No   Sexual activity: Never  Other Topics Concern   Not on file  Social History Narrative   Not on file   Social Drivers of Health   Financial Resource Strain: High Risk (11/14/2023)   Overall Financial Resource Strain (CARDIA)    Difficulty of Paying Living Expenses: Hard  Food Insecurity: No Food Insecurity (11/14/2023)   Hunger Vital Sign    Worried About Running Out of Food in the Last Year: Never true    Ran Out of Food in the Last Year: Never true  Transportation Needs: No Transportation Needs (11/14/2023)   PRAPARE -  Administrator, Civil Service (Medical): No    Lack of Transportation (Non-Medical): No  Physical Activity: Insufficiently Active (11/14/2023)   Exercise Vital Sign    Days of Exercise per Week: 3 days    Minutes of Exercise per Session: 30 min  Stress: No Stress Concern Present (11/14/2023)   [employer] of Occupational Health - Occupational Stress Questionnaire    Feeling of Stress : Not at all  Social Connections: Socially Integrated (11/14/2023)   Social Connection and Isolation Panel    Frequency of Communication with Friends and Family: More than three times a week    Frequency of Social Gatherings with Friends and Family: More than three times a week    Attends Religious Services: More than 4 times per year    Active Member of [employer] or Organizations: Yes    Attends Engineer, structural: More than 4 times per year    Marital Status: Married    Review of Systems Per HPI  Objective:  BP 118/72   Pulse 91   Temp 98.2 F (36.8 C)   Ht 5' 2 (1.575 m)   Wt 163 lb 6.4 oz (74.1 kg)   SpO2 99%   BMI 29.89 kg/m      05/17/2024    2:16 PM 03/24/2024    2:27 PM 02/18/2024   10:56 AM  BP/Weight  Systolic BP 118 112 122  Diastolic BP 72 64 72  Wt. (Lbs) 163.4 162 164  BMI 29.89 kg/m2 29.63 kg/m2 30 kg/m2    Physical Exam Vitals and nursing note reviewed.  Constitutional:      General: She is not in acute distress.    Appearance: Normal appearance.  HENT:     Head: Normocephalic and atraumatic.   Cardiovascular:     Rate and Rhythm: Normal rate and regular rhythm.  Pulmonary:     Effort: Pulmonary effort is normal.     Breath sounds: Normal breath sounds. No wheezing, rhonchi or rales.   Neurological:     Mental Status: She is alert.   Psychiatric:        Mood and Affect: Mood normal.        Behavior: Behavior normal.     Lab Results  Component Value Date   WBC 11.0 (H) 05/17/2024   HGB 12.8 05/17/2024   HCT 40.0 05/17/2024    PLT 121 (L) 05/17/2024   GLUCOSE 89 05/17/2024   CHOL 123 05/17/2024   TRIG 67 05/17/2024   HDL 58 05/17/2024   LDLCALC 51 05/17/2024   ALT 6 05/17/2024   AST 15 05/17/2024   NA 143 05/17/2024   K 3.8 05/17/2024   CL 101 05/17/2024   CREATININE 0.63 05/17/2024   BUN 9 05/17/2024   CO2 23 05/17/2024   TSH 0.950 05/17/2024   HGBA1C 7.1 (H) 05/17/2024     Assessment & Plan:  Primary hypertension Assessment & Plan: Stable.  Continue current medication.  Labs today.   Diabetes mellitus without complication (HCC) Assessment & Plan: A1c today to assess.  Continue metformin .  Orders: -     CMP14+EGFR -     Microalbumin / creatinine urine ratio -     Hemoglobin A1c  Hyperlipidemia associated with type 2 diabetes mellitus (HCC) Assessment & Plan: Has been stable simvastatin .  Continue.  Awaiting lipid panel.  Orders: -     Lipid panel  Hypothyroidism, unspecified type Assessment & Plan: Last TSH was abnormal.  Repeating today.  Orders: -     TSH  Thrombocytopenia (HCC) -     CBC    Follow-up:  6 months  Clydell Sposito Debrah Fan DO [facility]

## 2024-05-18 NOTE — Assessment & Plan Note (Signed)
 Last TSH was abnormal.  Repeating today.

## 2024-05-18 NOTE — Assessment & Plan Note (Signed)
 A1c today to assess.  Continue metformin .

## 2024-05-18 NOTE — Assessment & Plan Note (Signed)
 Has been stable simvastatin .  Continue.  Awaiting lipid panel.

## 2024-05-23 ENCOUNTER — Ambulatory Visit: Payer: Self-pay | Admitting: Family Medicine

## 2024-06-07 DIAGNOSIS — M1990 Unspecified osteoarthritis, unspecified site: Secondary | ICD-10-CM | POA: Diagnosis not present

## 2024-06-07 DIAGNOSIS — M79672 Pain in left foot: Secondary | ICD-10-CM | POA: Diagnosis not present

## 2024-06-07 DIAGNOSIS — E1140 Type 2 diabetes mellitus with diabetic neuropathy, unspecified: Secondary | ICD-10-CM | POA: Diagnosis not present

## 2024-06-07 DIAGNOSIS — E1142 Type 2 diabetes mellitus with diabetic polyneuropathy: Secondary | ICD-10-CM | POA: Diagnosis not present

## 2024-06-07 DIAGNOSIS — M79671 Pain in right foot: Secondary | ICD-10-CM | POA: Diagnosis not present

## 2024-06-07 DIAGNOSIS — M79674 Pain in right toe(s): Secondary | ICD-10-CM | POA: Diagnosis not present

## 2024-06-07 DIAGNOSIS — M79675 Pain in left toe(s): Secondary | ICD-10-CM | POA: Diagnosis not present

## 2024-06-07 DIAGNOSIS — L565 Disseminated superficial actinic porokeratosis (DSAP): Secondary | ICD-10-CM | POA: Diagnosis not present

## 2024-08-16 DIAGNOSIS — M79671 Pain in right foot: Secondary | ICD-10-CM | POA: Diagnosis not present

## 2024-08-16 DIAGNOSIS — M1990 Unspecified osteoarthritis, unspecified site: Secondary | ICD-10-CM | POA: Diagnosis not present

## 2024-08-16 DIAGNOSIS — M79672 Pain in left foot: Secondary | ICD-10-CM | POA: Diagnosis not present

## 2024-08-16 DIAGNOSIS — M79675 Pain in left toe(s): Secondary | ICD-10-CM | POA: Diagnosis not present

## 2024-08-16 DIAGNOSIS — M79674 Pain in right toe(s): Secondary | ICD-10-CM | POA: Diagnosis not present

## 2024-08-16 DIAGNOSIS — L565 Disseminated superficial actinic porokeratosis (DSAP): Secondary | ICD-10-CM | POA: Diagnosis not present

## 2024-08-16 DIAGNOSIS — E1140 Type 2 diabetes mellitus with diabetic neuropathy, unspecified: Secondary | ICD-10-CM | POA: Diagnosis not present

## 2024-08-16 DIAGNOSIS — E1142 Type 2 diabetes mellitus with diabetic polyneuropathy: Secondary | ICD-10-CM | POA: Diagnosis not present

## 2024-08-21 ENCOUNTER — Other Ambulatory Visit: Payer: Self-pay | Admitting: Family Medicine

## 2024-08-21 DIAGNOSIS — E119 Type 2 diabetes mellitus without complications: Secondary | ICD-10-CM

## 2024-08-21 DIAGNOSIS — E1169 Type 2 diabetes mellitus with other specified complication: Secondary | ICD-10-CM

## 2024-08-21 DIAGNOSIS — I10 Essential (primary) hypertension: Secondary | ICD-10-CM

## 2024-08-23 ENCOUNTER — Other Ambulatory Visit: Payer: Self-pay | Admitting: Family Medicine

## 2024-08-23 DIAGNOSIS — J4520 Mild intermittent asthma, uncomplicated: Secondary | ICD-10-CM

## 2024-09-29 ENCOUNTER — Ambulatory Visit: Admitting: Allergy & Immunology

## 2024-09-29 ENCOUNTER — Other Ambulatory Visit: Payer: Self-pay

## 2024-09-29 ENCOUNTER — Encounter: Payer: Self-pay | Admitting: Allergy & Immunology

## 2024-09-29 VITALS — BP 140/80 | HR 99 | Temp 98.1°F | Resp 18 | Ht 59.06 in | Wt 164.6 lb

## 2024-09-29 DIAGNOSIS — L508 Other urticaria: Secondary | ICD-10-CM

## 2024-09-29 DIAGNOSIS — Z91018 Allergy to other foods: Secondary | ICD-10-CM | POA: Diagnosis not present

## 2024-09-29 DIAGNOSIS — J4530 Mild persistent asthma, uncomplicated: Secondary | ICD-10-CM

## 2024-09-29 DIAGNOSIS — J301 Allergic rhinitis due to pollen: Secondary | ICD-10-CM

## 2024-09-29 NOTE — Progress Notes (Signed)
 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 Mild persistent asthma, uncomplicated - Lung testing looked stellar today.  - Daily controller medication(s): NOTHING - Prior to physical activity: albuterol  2 puffs 10-15 minutes before physical activity. - Rescue medications: albuterol  4 puffs every 4-6 hours as needed - Changes during respiratory infections or worsening symptoms: Add on Flovent  44mcg to 2 puffs twice daily for TWO WEEKS. - Asthma control goals:  * Full participation in all desired activities (may need albuterol  before activity) * Albuterol  use two time or less a week on average (not counting use with activity) * Cough interfering with sleep two time or less a month * Oral steroids no more than once a year * No hospitalizations  2. Chronic urticaria - Continue to avoid red meat.  - We are going to recheck those levels.  - DECREASE Claritin  to ONCE DAILY because this might be contributing to your dry eyes.   - Morning: Claritin  (loratadine ) 10mg    - Evening: Singulair  (montelukast ) 10mg  - We could add Xolair to help with controlling the symptoms.   3. Seasonal allergic rhinitis due to pollen - Previous testing showed: trees (ash pollen) - Continue with: Claritin  (loratadine ) 10mg  1-2 times daily and Singulair  (montelukast ) 10mg  daily - You can use an extra dose of the antihistamine, if needed, for breakthrough symptoms.   4. Return in about 6 months (around 03/30/2025). You can have the follow up appointment with Dr. Iva or a Nurse Practicioner (our Nurse Practitioners are excellent and always have Physician oversight!).    Subjective:   Valerie Thomas is a 78 y.o. female presenting today for follow up of  Chief Complaint  Patient presents  with   Follow-up    Valerie Thomas has a history of the following: Patient Active Problem List   Diagnosis Date Noted   Mild intermittent asthma without complication 09/25/2023   Allergy  to alpha-gal 09/25/2023   Seasonal allergic rhinitis due to pollen 09/25/2023   Mass of right thigh 07/15/2023   Knee pain 01/23/2023   Hyperlipidemia associated with type 2 diabetes mellitus (HCC) 10/22/2022   Hypothyroidism 10/22/2022   Chronic urticaria 04/30/2022   Obesity 02/13/2022   Gastroesophageal reflux disease 02/13/2022   Diabetes mellitus without complication (HCC) 02/13/2022   Hypertension 02/13/2022    History obtained from: chart review and patient.  Discussed the use of AI scribe software for clinical note transcription with the patient and/or guardian, who gave verbal consent to proceed.  Valerie Thomas is a 78 y.o. female presenting for a follow up visit.  She was last seen in April 2025 by Arlean one of our nurse practitioners.  At that time, she was continued on montelukast  as well as albuterol .  She has Flovent  that she has during flares.  On her rhinitis, she remained on Flonase  and was started on Astelin .  She continue to avoid mammalian meat.  She was started on triamcinolone  for her insect bites.  Since last visit, she has done well.  Asthma/Respiratory Symptom History: Her asthma is well-controlled with albuterol  used twice daily, and she has a separate inhaler for respiratory flares. She uses albuterol  almost daily and has not experienced recent exacerbations or nocturnal coughing, which were previously common.  Allergic Rhinitis Symptom History: She takes  Claritin  twice daily and Singulair  for allergies. She experiences sneezing and itchy, watery eyes but no rhinorrhea. She does report some dry eyes.   Food Allergy  Symptom History: She has a history of alpha-gal allergy  and avoids red meat, although she misses eating ham. Her allergy  levels have been decreasing over the years.  Her  blood pressure has been elevated, reaching up to 140 mmHg. She is currently on two antihypertensive medications. She is interested in exploring non-pharmacological methods, such as breathing exercises, for managing her blood pressure.  She has a history of diabetes with a recent blood sugar reading of 122 mg/dL. She attributes occasional higher sugar levels to dietary choices, such as consuming ice cream. She experiences dry eyes and occasional blurriness, which she associates with her blood sugar levels.  She lives alone with a cat named Chico, which she inherited from her daughter who resides in [place] .   Otherwise, there have been no changes to her past medical history, surgical history, family history, or social history.    Review of systems otherwise negative other than that mentioned in the HPI.    Objective:   Blood pressure (!) 140/80, pulse 99, temperature 98.1 F (36.7 C), temperature source Temporal, resp. rate 18, height 4' 11.06 (1.5 m), weight 164 lb 9.6 oz (74.7 kg), SpO2 94%. Body mass index is 33.18 kg/m.    Physical Exam Vitals reviewed.  Constitutional:      Appearance: She is well-developed.  HENT:     Head: Normocephalic and atraumatic.     Right Ear: Tympanic membrane, ear canal and external ear normal. No drainage, swelling or tenderness. Tympanic membrane is not injected, scarred, erythematous, retracted or bulging.     Left Ear: Tympanic membrane, ear canal and external ear normal. No drainage, swelling or tenderness. Tympanic membrane is not injected, scarred, erythematous, retracted or bulging.     Nose: No nasal deformity, septal deviation, mucosal edema or rhinorrhea.     Right Turbinates: Enlarged, swollen and pale.     Left Turbinates: Enlarged, swollen and pale.     Right Sinus: No maxillary sinus tenderness or frontal sinus tenderness.     Left Sinus: No maxillary sinus tenderness or frontal sinus tenderness.     Comments: Runny nose.     Mouth/Throat:     Lips: Pink.     Mouth: Mucous membranes are moist. Mucous membranes are not pale and not dry.     Pharynx: Uvula midline.  Eyes:     General: Lids are normal. Allergic shiner present.        Right eye: No discharge.        Left eye: No discharge.     Conjunctiva/sclera: Conjunctivae normal.     Right eye: Right conjunctiva is not injected. No chemosis.    Left eye: Left conjunctiva is not injected. No chemosis.    Pupils: Pupils are equal, round, and reactive to light.  Cardiovascular:     Rate and Rhythm: Normal rate and regular rhythm.     Heart sounds: Normal heart sounds.  Pulmonary:     Effort: Pulmonary effort is normal. No tachypnea, accessory muscle usage or respiratory distress.     Breath sounds: Normal breath sounds. No decreased breath sounds, wheezing, rhonchi or rales.  Chest:     Chest wall: No tenderness.  Lymphadenopathy:     Head:     Right side of head: No submandibular, tonsillar or occipital adenopathy.     Left side of head: No  submandibular, tonsillar or occipital adenopathy.     Cervical: No cervical adenopathy.  Skin:    General: Skin is warm.     Capillary Refill: Capillary refill takes less than 2 seconds.     Coloration: Skin is not pale.     Findings: No abrasion, erythema, petechiae or rash. Rash is not papular, urticarial or vesicular.     Comments: Some excoriations present. No urticaria appreciated today.   Neurological:     Mental Status: She is alert.  Psychiatric:        Behavior: Behavior is cooperative.      Diagnostic studies:    Spirometry: results normal (FEV1: 1.41/100%, FVC: 1.55/85%, FEV1/FVC: 91%).    Spirometry consistent with normal pattern.   Allergy  Studies: none       Marty Shaggy, MD  [facility] 

## 2024-09-29 NOTE — Patient Instructions (Addendum)
 1. Mild persistent asthma, uncomplicated - Lung testing looked stellar today.  - Daily controller medication(s): NOTHING - Prior to physical activity: albuterol  2 puffs 10-15 minutes before physical activity. - Rescue medications: albuterol  4 puffs every 4-6 hours as needed - Changes during respiratory infections or worsening symptoms: Add on Flovent  44mcg to 2 puffs twice daily for TWO WEEKS. - Asthma control goals:  * Full participation in all desired activities (may need albuterol  before activity) * Albuterol  use two time or less a week on average (not counting use with activity) * Cough interfering with sleep two time or less a month * Oral steroids no more than once a year * No hospitalizations  2. Chronic urticaria - Continue to avoid red meat.  - We are going to recheck those levels.  - DECREASE Claritin  to ONCE DAILY because this might be contributing to your dry eyes.   - Morning: Claritin  (loratadine ) 10mg    - Evening: Singulair  (montelukast ) 10mg  - We could add Xolair to help with controlling the symptoms.   3. Seasonal allergic rhinitis due to pollen - Previous testing showed: trees (ash pollen) - Continue with: Claritin  (loratadine ) 10mg  1-2 times daily and Singulair  (montelukast ) 10mg  daily - You can use an extra dose of the antihistamine, if needed, for breakthrough symptoms.   4. Return in about 6 months (around 03/30/2025). You can have the follow up appointment with Dr. Iva or a Nurse Practicioner (our Nurse Practitioners are excellent and always have Physician oversight!).    Please inform us  of any Emergency Department visits, hospitalizations, or changes in symptoms. Call us  before going to the ED for breathing or allergy  symptoms since we might be able to fit you in for a sick visit. Feel free to contact us  anytime with any questions, problems, or concerns.  It was a pleasure to see you again today!    Websites that have reliable patient information: 1.  American Academy of Asthma, Allergy , and Immunology: www.aaaai.org 2. Food Allergy  Research and Education (FARE): foodallergy.org 3. Mothers of Asthmatics: http://www.asthmacommunitynetwork.org 4. American College of Allergy , Asthma, and Immunology: www.acaai.org      "Like" us  on Facebook and Instagram for our latest updates!      A healthy democracy works best when [employer] participate! Make sure you are registered to vote! If you have moved or changed any of your contact information, you will need to get this updated before voting! Scan the QR codes below to learn more!

## 2024-10-01 LAB — ALPHA-GAL PANEL
Allergen Lamb IgE: 10.7 kU/L — AB
Beef IgE: 21.4 kU/L — AB
IgE (Immunoglobulin E), Serum: 560 [IU]/mL — ABNORMAL HIGH (ref 6–495)
O215-IgE Alpha-Gal: 85.9 kU/L — AB
Pork IgE: 1.96 kU/L — AB

## 2024-10-12 ENCOUNTER — Ambulatory Visit: Payer: Self-pay | Admitting: Allergy & Immunology

## 2024-10-30 ENCOUNTER — Other Ambulatory Visit: Payer: Self-pay | Admitting: Family Medicine

## 2024-11-17 ENCOUNTER — Encounter: Payer: Self-pay | Admitting: Family Medicine

## 2024-11-17 ENCOUNTER — Ambulatory Visit: Admitting: Family Medicine

## 2024-11-17 VITALS — BP 130/84 | HR 99 | Temp 98.1°F | Ht 59.06 in | Wt 163.0 lb

## 2024-11-17 DIAGNOSIS — R0989 Other specified symptoms and signs involving the circulatory and respiratory systems: Secondary | ICD-10-CM | POA: Diagnosis not present

## 2024-11-17 DIAGNOSIS — B9789 Other viral agents as the cause of diseases classified elsewhere: Secondary | ICD-10-CM

## 2024-11-17 DIAGNOSIS — J988 Other specified respiratory disorders: Secondary | ICD-10-CM

## 2024-11-17 DIAGNOSIS — E785 Hyperlipidemia, unspecified: Secondary | ICD-10-CM

## 2024-11-17 DIAGNOSIS — Z7984 Long term (current) use of oral hypoglycemic drugs: Secondary | ICD-10-CM

## 2024-11-17 DIAGNOSIS — E1169 Type 2 diabetes mellitus with other specified complication: Secondary | ICD-10-CM

## 2024-11-17 DIAGNOSIS — I10 Essential (primary) hypertension: Secondary | ICD-10-CM | POA: Diagnosis not present

## 2024-11-17 DIAGNOSIS — E119 Type 2 diabetes mellitus without complications: Secondary | ICD-10-CM

## 2024-11-17 MED ORDER — PROMETHAZINE-DM 6.25-15 MG/5ML PO SYRP: 5.0000 mL | ORAL_SOLUTION | Freq: Four times a day (QID) | ORAL | 0 refills | Status: AC | PRN

## 2024-11-17 NOTE — Patient Instructions (Signed)
 A1c today.  Follow up in 6 months.  Take care  Dr. Bluford

## 2024-11-18 ENCOUNTER — Ambulatory Visit: Payer: Self-pay | Admitting: Family Medicine

## 2024-11-18 DIAGNOSIS — R0989 Other specified symptoms and signs involving the circulatory and respiratory systems: Secondary | ICD-10-CM | POA: Insufficient documentation

## 2024-11-18 LAB — HEMOGLOBIN A1C
Est. average glucose Bld gHb Est-mCnc: 154 mg/dL
Hgb A1c MFr Bld: 7 % — ABNORMAL HIGH (ref 4.8–5.6)

## 2024-11-18 NOTE — Assessment & Plan Note (Addendum)
 A1c was done today and returned at 7.  Continue metformin .

## 2024-11-18 NOTE — Progress Notes (Signed)
 Subjective:  Patient ID: Valerie Thomas, female    DOB: 12/21/1945  Age: 78 y.o. MRN: [id number]  CC:   Chief Complaint  Patient presents with   6 month follow up - HTN    Sneezing on and off, runny nose, tickle in throat , wax in ears , refill cough syrup    HPI:  78 year old female presents for follow-up.  BP is well-controlled given her age.  She is on amlodipine  and valsartan /HCTZ.  No side effects.   She reports that she has had some recent respiratory symptoms.  She has had some wax in her ears and has also had some runny nose and sneezing.  Some postnasal drip.  No fever.  Lipids at goal on simvastatin .  Needs updated A1c.  She is compliant with metformin .  Patient Active Problem List   Diagnosis Date Noted   Respiratory symptoms 11/18/2024   Mild intermittent asthma without complication 09/25/2023   Allergy  to alpha-gal 09/25/2023   Seasonal allergic rhinitis due to pollen 09/25/2023   Mass of right thigh 07/15/2023   Knee pain 01/23/2023   Hyperlipidemia associated with type 2 diabetes mellitus (HCC) 10/22/2022   Hypothyroidism 10/22/2022   Chronic urticaria 04/30/2022   Obesity 02/13/2022   Gastroesophageal reflux disease 02/13/2022   Diabetes mellitus without complication (HCC) 02/13/2022   Hypertension 02/13/2022    Social Hx   Social History   Socioeconomic History   Marital status: Widowed    Spouse name: Not on file   Number of children: Not on file   Years of education: Not on file   Highest education level: Not on file  Occupational History   Not on file  Tobacco Use   Smoking status: Never    Passive exposure: Past   Smokeless tobacco: Never  Substance and Sexual Activity   Alcohol use: No   Drug use: No   Sexual activity: Never  Other Topics Concern   Not on file  Social History Narrative   Not on file   Social Drivers of Health   Tobacco Use: Low Risk (11/17/2024)   Patient History    Smoking Tobacco Use: Never    Smokeless  Tobacco Use: Never    Passive Exposure: Past  Financial Resource Strain: High Risk (11/14/2023)   Overall Financial Resource Strain (CARDIA)    Difficulty of Paying Living Expenses: Hard  Food Insecurity: No Food Insecurity (11/14/2023)   Hunger Vital Sign    Worried About Running Out of Food in the Last Year: Never true    Ran Out of Food in the Last Year: Never true  Transportation Needs: No Transportation Needs (11/14/2023)   PRAPARE - Administrator, Civil Service (Medical): No    Lack of Transportation (Non-Medical): No  Physical Activity: Insufficiently Active (11/14/2023)   Exercise Vital Sign    Days of Exercise per Week: 3 days    Minutes of Exercise per Session: 30 min  Stress: No Stress Concern Present (11/14/2023)   [employer] of Occupational Health - Occupational Stress Questionnaire    Feeling of Stress : Not at all  Social Connections: Socially Integrated (11/14/2023)   Social Connection and Isolation Panel    Frequency of Communication with Friends and Family: More than three times a week    Frequency of Social Gatherings with Friends and Family: More than three times a week    Attends Religious Services: More than 4 times per year    Active Member of Clubs or  Organizations: Yes    Attends Engineer, Structural: More than 4 times per year    Marital Status: Married  Depression (PHQ2-9): Medium Risk (02/18/2024)   Depression (PHQ2-9)    PHQ-2 Score: 6  Alcohol Screen: Low Risk (11/14/2023)   Alcohol Screen    Last Alcohol Screening Score (AUDIT): 0  Housing: Low Risk (11/14/2023)   Housing Stability Vital Sign    Unable to Pay for Housing in the Last Year: No    Number of Times Moved in the Last Year: 0    Homeless in the Last Year: No  Utilities: Not At Risk (11/14/2023)   AHC Utilities    Threatened with loss of utilities: No  Health Literacy: Adequate Health Literacy (11/14/2023)   B1300 Health Literacy    Frequency of need for  help with medical instructions: Never    Review of Systems Per HPI  Objective:  BP 130/84   Pulse 99   Temp 98.1 F (36.7 C)   Ht 4' 11.06 (1.5 m)   Wt 163 lb (73.9 kg)   SpO2 97%   BMI 32.86 kg/m      11/17/2024    2:14 PM 09/29/2024   11:36 AM 05/17/2024    2:16 PM  BP/Weight  Systolic BP 130 140 118  Diastolic BP 84 80 72  Wt. (Lbs) 163 164.6 163.4  BMI 32.86 kg/m2 33.18 kg/m2 29.89 kg/m2    Physical Exam Vitals and nursing note reviewed.  Constitutional:      General: She is not in acute distress.    Appearance: Normal appearance.  HENT:     Head: Normocephalic and atraumatic.  Eyes:     General:        Right eye: No discharge.        Left eye: No discharge.     Conjunctiva/sclera: Conjunctivae normal.  Cardiovascular:     Rate and Rhythm: Normal rate and regular rhythm.  Pulmonary:     Effort: Pulmonary effort is normal.     Breath sounds: Normal breath sounds. No wheezing, rhonchi or rales.  Neurological:     Mental Status: She is alert.  Psychiatric:        Mood and Affect: Mood normal.        Behavior: Behavior normal.     Lab Results  Component Value Date   WBC 11.0 (H) 05/17/2024   HGB 12.8 05/17/2024   HCT 40.0 05/17/2024   PLT 121 (L) 05/17/2024   GLUCOSE 89 05/17/2024   CHOL 123 05/17/2024   TRIG 67 05/17/2024   HDL 58 05/17/2024   LDLCALC 51 05/17/2024   ALT 6 05/17/2024   AST 15 05/17/2024   NA 143 05/17/2024   K 3.8 05/17/2024   CL 101 05/17/2024   CREATININE 0.63 05/17/2024   BUN 9 05/17/2024   CO2 23 05/17/2024   TSH 0.950 05/17/2024   HGBA1C 7.0 (H) 11/17/2024     Assessment & Plan:  Diabetes mellitus without complication (HCC) Assessment & Plan: A1c was done today and returned at 7.  Continue metformin .  Orders: -     Hemoglobin A1c  Viral respiratory infection  Primary hypertension Assessment & Plan: Stable.  Continue amlodipine , valsartan /HCTZ.   Hyperlipidemia associated with type 2 diabetes mellitus  (HCC) Assessment & Plan: At goal.  Continue simvastatin .   Respiratory symptoms Assessment & Plan: Promethazine  DM as needed.  Orders: -     Promethazine -DM; Take 5 mLs by mouth 4 (four) times daily as  needed for cough.  Dispense: 118 mL; Refill: 0    Follow-up: 6 months  Jasilyn Holderman Bluford DO [facility]

## 2024-11-18 NOTE — Assessment & Plan Note (Signed)
At goal. Continue simvastatin.

## 2024-11-18 NOTE — Assessment & Plan Note (Signed)
 Stable.  Continue amlodipine , valsartan /HCTZ.

## 2024-11-18 NOTE — Assessment & Plan Note (Signed)
 Promethazine DM as needed.

## 2024-11-19 ENCOUNTER — Ambulatory Visit: Payer: Medicare Other

## 2024-11-19 VITALS — BP 126/68 | HR 64 | Ht 59.0 in | Wt 164.0 lb

## 2024-11-19 DIAGNOSIS — Z0000 Encounter for general adult medical examination without abnormal findings: Secondary | ICD-10-CM | POA: Diagnosis not present

## 2024-11-19 NOTE — Patient Instructions (Signed)
 Valerie Thomas,  Thank you for taking the time for your Medicare Wellness Visit. I appreciate your continued commitment to your health goals. Please review the care plan we discussed, and feel free to reach out if I can assist you further.  Please note that Annual Wellness Visits do not include a physical exam. Some assessments may be limited, especially if the visit was conducted virtually. If needed, we may recommend an in-person follow-up with your provider.  Ongoing Care Seeing your primary care provider every 3 to 6 months helps us  monitor your health and provide consistent, personalized care.   1 year follow up for Medicare well visit: Thursday December 01, 2025 at 1:00pm with medicare wellness nurse in office  Referrals If a referral was made during today's visit and you haven't received any updates within two weeks, please contact the referred provider directly to check on the status.  No Referrals placed today     Recommended Screenings:  Health Maintenance  Topic Date Due   Complete foot exam   10/22/2023   Medicare Annual Wellness Visit  11/13/2024   COVID-19 Vaccine (7 - 2025-26 season) 12/03/2024*   Eye exam for diabetics  12/23/2024   Breast Cancer Screening  04/02/2025   Yearly kidney function blood test for diabetes  05/17/2025   Yearly kidney health urinalysis for diabetes  05/17/2025   Hemoglobin A1C  05/18/2025   Osteoporosis screening with Bone Density Scan  12/09/2025   DTaP/Tdap/Td vaccine (3 - Td or Tdap) 09/03/2034   Pneumococcal Vaccine for age over 58  Completed   Flu Shot  Completed   Zoster (Shingles) Vaccine  Completed   Meningitis B Vaccine  Aged Out   Colon Cancer Screening  Discontinued   Hepatitis C Screening  Discontinued  *Topic was postponed. The date shown is not the original due date.       11/19/2024    1:58 PM  Advanced Directives  Does Patient Have a Medical Advance Directive? No  Would patient like information on creating a medical  advance directive? No - Patient declined    Vision: Annual vision screenings are recommended for early detection of glaucoma, cataracts, and diabetic retinopathy. These exams can also reveal signs of chronic conditions such as diabetes and high blood pressure.  Dental: Annual dental screenings help detect early signs of oral cancer, gum disease, and other conditions linked to overall health, including heart disease and diabetes.  Please see the attached documents for additional preventive care recommendations.

## 2024-11-19 NOTE — Progress Notes (Signed)
 "  No voiced or noted concerns at this time  Chief Complaint  Patient presents with   Medicare Wellness     Subjective:   Valerie Thomas is a 78 y.o. female who presents for a Medicare Annual Wellness Visit.  Visit info / Clinical Intake: Medicare Wellness Visit Type:: Subsequent Annual Wellness Visit Persons participating in visit and providing information:: patient Medicare Wellness Visit Mode:: In-person (required for WTM) Interpreter Needed?: No Pre-visit prep was completed: yes AWV questionnaire completed by patient prior to visit?: no Living arrangements:: (!) lives alone Patient's Overall Health Status Rating: good Typical amount of pain: none Does pain affect daily life?: no Are you currently prescribed opioids?: (!) yes  Dietary Habits and Nutritional Risks How many meals a day?: 3 Eats fruit and vegetables daily?: yes Most meals are obtained by: preparing own meals In the last 2 weeks, have you had any of the following?: none Diabetic:: (!) yes Any non-healing wounds?: no How often do you check your BS?: 1 Would you like to be referred to a Nutritionist or for Diabetic Management? : no  Functional Status Activities of Daily Living (to include ambulation/medication): Independent Ambulation: Independent Medication Administration: Independent Home Management (perform basic housework or laundry): Independent Manage your own finances?: yes Primary transportation is: driving Concerns about vision?: no *vision screening is required for WTM* Concerns about hearing?: no  Fall Screening Falls in the past year?: 0 Number of falls in past year: 0 Was there an injury with Fall?: 0 Fall Risk Category Calculator: 0 Patient Fall Risk Level: Low Fall Risk  Fall Risk Patient at Risk for Falls Due to: No Fall Risks Fall risk Follow up: Falls evaluation completed; Education provided; Falls prevention discussed  Home and Transportation Safety: All rugs have non-skid  backing?: yes All stairs or steps have railings?: yes Grab bars in the bathtub or shower?: yes Have non-skid surface in bathtub or shower?: yes Good home lighting?: yes Regular seat belt use?: yes Hospital stays in the last year:: no  Cognitive Assessment Difficulty concentrating, remembering, or making decisions? : no Will 6CIT or Mini Cog be Completed: yes What year is it?: 0 points What month is it?: 0 points Give patient an address phrase to remember (5 components): [address] [place] About what time is it?: 0 points Count backwards from 20 to 1: 0 points Say the months of the year in reverse: 0 points Repeat the address phrase from earlier: 0 points 6 CIT Score: 0 points  Advance Directives (For Healthcare) Does Patient Have a Medical Advance Directive?: No (patient is working on packet at home with daughter) Would patient like information on creating a medical advance directive?: No - Patient declined  Reviewed/Updated  Reviewed/Updated: Reviewed All (Medical, Surgical, Family, Medications, Allergies, Care Teams, Patient Goals)    Allergies (verified) Alpha-gal   Current Medications (verified) Outpatient Encounter Medications as of 11/19/2024  Medication Sig   ACCU-CHEK AVIVA PLUS test strip TEST ONCE DAILY   Accu-Chek Softclix Lancets lancets TESTS ONCE DAILY   acetaminophen  (TYLENOL ) 500 MG tablet Take 1-2 tablets (500-1,000 mg total) by mouth every 6 (six) hours as needed.   albuterol  (VENTOLIN  HFA) 108 (90 Base) MCG/ACT inhaler USE 2 INHALATIONS BY MOUTH EVERY 6 HOURS AS NEEDED FOR WHEEZING  OR SHORTNESS OF BREATH   amLODipine  (NORVASC ) 5 MG tablet TAKE 1 TABLET BY MOUTH DAILY   azelastine  (ASTELIN ) 0.1 % nasal spray Place 2 sprays in each nostril once or twice a day  as needed for a runny nose   chlorhexidine (PERIDEX) 0.12 % solution 15 mLs 2 (two) times daily.   Cod Liver Oil CAPS Take 1 capsule by mouth daily.   EPINEPHrine  0.3 mg/0.3 mL IJ SOAJ  injection Inject 0.3 mg into the muscle as needed for anaphylaxis.   fluticasone  (FLONASE ) 50 MCG/ACT nasal spray Place 2 sprays into both nostrils daily as needed (stuffy nose).   fluticasone -salmeterol (WIXELA INHUB) 100-50 MCG/ACT AEPB Use 1 puff once a day for 2 weeks or until cough and wheeze free, then stop   gabapentin  (NEURONTIN ) 300 MG capsule TAKE 1 CAPSULE BY MOUTH TWICE  DAILY   levothyroxine  (SYNTHROID ) 25 MCG tablet TAKE 1 TABLET BY MOUTH DAILY   loratadine  (CLARITIN ) 10 MG tablet Take 1 tablet (10 mg total) by mouth every morning.   metFORMIN  (GLUCOPHAGE ) 500 MG tablet TAKE 1 TABLET BY MOUTH TWICE  DAILY WITH MEALS   montelukast  (SINGULAIR ) 10 MG tablet TAKE 1 TABLET BY MOUTH DAILY   potassium chloride  SA (KLOR-CON  M) 20 MEQ tablet TAKE 1 TABLET BY MOUTH TWICE  DAILY   promethazine -dextromethorphan (PROMETHAZINE -DM) 6.25-15 MG/5ML syrup Take 5 mLs by mouth 4 (four) times daily as needed for cough.   simvastatin  (ZOCOR ) 40 MG tablet TAKE 1 TABLET BY MOUTH DAILY   traMADol  (ULTRAM ) 50 MG tablet Take 1 tablet (50 mg total) by mouth every 12 (twelve) hours as needed for severe pain (pain score 7-10) or moderate pain (pain score 4-6).   triamcinolone  ointment (KENALOG ) 0.1 % APPLY 1 APPLICATION TOPICALLY  TWICE DAILY   valsartan -hydrochlorothiazide  (DIOVAN -HCT) 160-25 MG tablet TAKE 1 TABLET BY MOUTH DAILY   No facility-administered encounter medications on file as of 11/19/2024.    History: Past Medical History:  Diagnosis Date   Acid reflux    Asthma    Diabetes mellitus without complication (HCC)    Eczema    Hypercholesteremia    Hypertension    Thyroid  disease    Past Surgical History:  Procedure Laterality Date   ABDOMINAL HYSTERECTOMY     HAND SURGERY     THYROID  SURGERY     torn ligament     neck surgery    UTERINE FIBROID SURGERY     Family History  Problem Relation Age of Onset   Asthma Sister    Allergic rhinitis Neg Hx    Urticaria Neg Hx    Eczema Neg  Hx    Social History   Occupational History   Not on file  Tobacco Use   Smoking status: Never    Passive exposure: Past   Smokeless tobacco: Never  Substance and Sexual Activity   Alcohol use: No   Drug use: No   Sexual activity: Never   Tobacco Counseling Counseling given: Yes  SDOH Screenings   Food Insecurity: No Food Insecurity (11/19/2024)  Housing: Low Risk (11/19/2024)  Transportation Needs: No Transportation Needs (11/19/2024)  Utilities: Not At Risk (11/19/2024)  Alcohol Screen: Low Risk (11/14/2023)  Depression (PHQ2-9): Low Risk (11/19/2024)  Financial Resource Strain: High Risk (11/14/2023)  Physical Activity: Inactive (11/19/2024)  Social Connections: Moderately Isolated (11/19/2024)  Stress: No Stress Concern Present (11/19/2024)  Tobacco Use: Low Risk (11/19/2024)  Health Literacy: Adequate Health Literacy (11/19/2024)   See flowsheets for full screening details  Depression Screen PHQ 2 & 9 Depression Scale- Over the past 2 weeks, how often have you been bothered by any of the following problems? Little interest or pleasure in doing things: 0 Feeling down, depressed, or  hopeless (PHQ Adolescent also includes...irritable): 1 PHQ-2 Total Score: 1 Trouble falling or staying asleep, or sleeping too much: 1 Feeling tired or having little energy: 1 Poor appetite or overeating (PHQ Adolescent also includes...weight loss): 0 Feeling bad about yourself - or that you are a failure or have let yourself or your family down: 0 Trouble concentrating on things, such as reading the newspaper or watching television (PHQ Adolescent also includes...like school work): 0 Moving or speaking so slowly that other people could have noticed. Or the opposite - being so fidgety or restless that you have been moving around a lot more than usual: 0 Thoughts that you would be better off dead, or of hurting yourself in some way: 0 PHQ-9 Total Score: 3 If you checked off any problems,  how difficult have these problems made it for you to do your work, take care of things at home, or get along with other people?: Not difficult at all  Depression Treatment Depression Interventions/Treatment : EYV7-0 Score <4 Follow-up Not Indicated     Goals Addressed               This Visit's Progress     I [facility] some [facility] and get better at painting (pt-stated)               Objective:    Today's Vitals   11/19/24 1615  BP: 126/68  Pulse: 64  SpO2: 98%  Weight: 164 lb (74.4 kg)  Height: 4' 11 (1.499 m)   Body mass index is 33.12 kg/m.  Hearing/Vision screen Hearing Screening - Comments:: Patient denies any hearing difficulties.   Vision Screening - Comments:: Wears rx glasses - up to date with routine eye exams with  Oneil Kawasaki Immunizations and Health Maintenance Health Maintenance  Topic Date Due   FOOT EXAM  10/22/2023   Medicare Annual Wellness (AWV)  11/13/2024   COVID-19 Vaccine (7 - 2025-26 season) 12/03/2024 (Originally 08/02/2024)   OPHTHALMOLOGY EXAM  12/23/2024   Mammogram  04/02/2025   Diabetic kidney evaluation - eGFR measurement  05/17/2025   Diabetic kidney evaluation - Urine ACR  05/17/2025   HEMOGLOBIN A1C  05/18/2025   Bone Density Scan  12/09/2025   DTaP/Tdap/Td (3 - Td or Tdap) 09/03/2034   Pneumococcal Vaccine: 50+ Years  Completed   Influenza Vaccine  Completed   Zoster Vaccines- Shingrix  Completed   Meningococcal B Vaccine  Aged Out   Colonoscopy  Discontinued   Hepatitis C Screening  Discontinued        Assessment/Plan:  This is a routine wellness examination for Michelyn.  Patient Care Team: Cook, Jayce G, DO as PCP - General (Family Medicine) Kawasaki Oneil, DO (Optometry) Ambs, Arlean HERO, FNP as Nurse Practitioner (Allergy ) Iva Marty Saltness, MD as Consulting Physician (Allergy  and Immunology) Fernande Lamar LABOR, DPM as Referring Physician (Podiatry)  I have personally  reviewed and noted the following in the patients chart:   Medical and social history Use of alcohol, tobacco or illicit drugs  Current medications and supplements including opioid prescriptions. Functional ability and status Nutritional status Physical activity Advanced directives List of other physicians Hospitalizations, surgeries, and ER visits in previous 12 months Vitals Screenings to include cognitive, depression, and falls Referrals and appointments  No orders of the defined types were placed in this encounter.  In addition, I have reviewed and discussed with patient certain preventive protocols, quality metrics, and best practice recommendations. A written  personalized care plan for preventive services as well as general preventive health recommendations were provided to patient.   Duvid Smalls, CMA   11/19/2024   Return on December 01, 2025 at 1:00pm, for your yearly Medicare Wellness Visit in person.  After Visit Summary: (Mail) Due to this being a telephonic visit, the after visit summary with patients personalized plan was offered to patient via mail   "

## 2024-11-28 ENCOUNTER — Other Ambulatory Visit: Payer: Self-pay | Admitting: Family Medicine

## 2024-11-28 DIAGNOSIS — I10 Essential (primary) hypertension: Secondary | ICD-10-CM

## 2024-12-02 ENCOUNTER — Other Ambulatory Visit: Payer: Self-pay | Admitting: Family Medicine

## 2025-04-13 ENCOUNTER — Ambulatory Visit: Admitting: Allergy & Immunology

## 2025-05-18 ENCOUNTER — Ambulatory Visit: Admitting: Family Medicine

## 2025-12-01 ENCOUNTER — Ambulatory Visit
